# Patient Record
Sex: Female | Born: 1935 | Race: White | Hispanic: No | Marital: Single | Smoking: Former smoker
Health system: Southern US, Community
[De-identification: ages and names within clinical notes are randomized; demographics above are authoritative.]

## PROBLEM LIST (undated history)

## (undated) DIAGNOSIS — M1712 Unilateral primary osteoarthritis, left knee: Secondary | ICD-10-CM

## (undated) DIAGNOSIS — E559 Vitamin D deficiency, unspecified: Secondary | ICD-10-CM

## (undated) DIAGNOSIS — M545 Low back pain, unspecified: Secondary | ICD-10-CM

## (undated) DIAGNOSIS — L309 Dermatitis, unspecified: Secondary | ICD-10-CM

## (undated) DIAGNOSIS — E785 Hyperlipidemia, unspecified: Secondary | ICD-10-CM

## (undated) DIAGNOSIS — M79604 Pain in right leg: Secondary | ICD-10-CM

## (undated) DIAGNOSIS — K279 Peptic ulcer, site unspecified, unspecified as acute or chronic, without hemorrhage or perforation: Secondary | ICD-10-CM

## (undated) DIAGNOSIS — M79601 Pain in right arm: Secondary | ICD-10-CM

## (undated) DIAGNOSIS — N189 Chronic kidney disease, unspecified: Secondary | ICD-10-CM

## (undated) DIAGNOSIS — R600 Localized edema: Secondary | ICD-10-CM

## (undated) DIAGNOSIS — I5189 Other ill-defined heart diseases: Secondary | ICD-10-CM

## (undated) DIAGNOSIS — M109 Gout, unspecified: Secondary | ICD-10-CM

## (undated) DIAGNOSIS — M8580 Other specified disorders of bone density and structure, unspecified site: Secondary | ICD-10-CM

## (undated) DIAGNOSIS — M79602 Pain in left arm: Secondary | ICD-10-CM

## (undated) DIAGNOSIS — J449 Chronic obstructive pulmonary disease, unspecified: Secondary | ICD-10-CM

## (undated) HISTORY — PX: ABDOMINAL HYSTERECTOMY: SHX81

## (undated) HISTORY — DX: Localized edema: R60.0

## (undated) HISTORY — DX: Vitamin D deficiency, unspecified: E55.9

## (undated) HISTORY — DX: Chronic obstructive pulmonary disease, unspecified: J44.9

## (undated) HISTORY — DX: Other ill-defined heart diseases: I51.89

## (undated) HISTORY — DX: Hyperlipidemia, unspecified: E78.5

## (undated) HISTORY — DX: Chronic kidney disease, unspecified: N18.9

## (undated) HISTORY — PX: KIDNEY SURGERY: SHX687

## (undated) HISTORY — DX: Pain in right arm: M79.601

## (undated) HISTORY — PX: TUBAL LIGATION: SHX77

## (undated) HISTORY — DX: Dermatitis, unspecified: L30.9

## (undated) HISTORY — DX: Other specified disorders of bone density and structure, unspecified site: M85.80

## (undated) HISTORY — DX: Pain in right leg: M79.604

## (undated) HISTORY — DX: Low back pain, unspecified: M54.50

## (undated) HISTORY — DX: Peptic ulcer, site unspecified, unspecified as acute or chronic, without hemorrhage or perforation: K27.9

## (undated) HISTORY — DX: Hypercalcemia: E83.52

## (undated) HISTORY — DX: Unilateral primary osteoarthritis, left knee: M17.12

## (undated) HISTORY — PX: OVARIAN CYST SURGERY: SHX726

## (undated) HISTORY — PX: APPENDECTOMY: SHX54

## (undated) HISTORY — DX: Pain in left arm: M79.602

## (undated) HISTORY — DX: Gout, unspecified: M10.9

---
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
## 2015-01-17 DIAGNOSIS — R0902 Hypoxemia: Secondary | ICD-10-CM | POA: Diagnosis not present

## 2015-02-05 DIAGNOSIS — Z4689 Encounter for fitting and adjustment of other specified devices: Secondary | ICD-10-CM | POA: Diagnosis not present

## 2015-02-05 DIAGNOSIS — N8110 Cystocele, unspecified: Secondary | ICD-10-CM | POA: Diagnosis not present

## 2015-02-17 DIAGNOSIS — R0902 Hypoxemia: Secondary | ICD-10-CM | POA: Diagnosis not present

## 2015-03-06 DIAGNOSIS — E782 Mixed hyperlipidemia: Secondary | ICD-10-CM | POA: Diagnosis not present

## 2015-03-06 DIAGNOSIS — N183 Chronic kidney disease, stage 3 (moderate): Secondary | ICD-10-CM | POA: Diagnosis not present

## 2015-03-06 DIAGNOSIS — E538 Deficiency of other specified B group vitamins: Secondary | ICD-10-CM | POA: Diagnosis not present

## 2015-03-06 DIAGNOSIS — E559 Vitamin D deficiency, unspecified: Secondary | ICD-10-CM | POA: Diagnosis not present

## 2015-03-06 DIAGNOSIS — M109 Gout, unspecified: Secondary | ICD-10-CM | POA: Diagnosis not present

## 2015-03-13 DIAGNOSIS — I10 Essential (primary) hypertension: Secondary | ICD-10-CM | POA: Diagnosis not present

## 2015-03-13 DIAGNOSIS — E782 Mixed hyperlipidemia: Secondary | ICD-10-CM | POA: Diagnosis not present

## 2015-03-13 DIAGNOSIS — N183 Chronic kidney disease, stage 3 (moderate): Secondary | ICD-10-CM | POA: Diagnosis not present

## 2015-03-13 DIAGNOSIS — Z23 Encounter for immunization: Secondary | ICD-10-CM | POA: Diagnosis not present

## 2015-03-13 DIAGNOSIS — M79601 Pain in right arm: Secondary | ICD-10-CM | POA: Diagnosis not present

## 2015-03-13 DIAGNOSIS — M109 Gout, unspecified: Secondary | ICD-10-CM | POA: Diagnosis not present

## 2015-03-17 DIAGNOSIS — D485 Neoplasm of uncertain behavior of skin: Secondary | ICD-10-CM | POA: Diagnosis not present

## 2015-03-18 DIAGNOSIS — R0902 Hypoxemia: Secondary | ICD-10-CM | POA: Diagnosis not present

## 2015-04-10 DIAGNOSIS — Z9181 History of falling: Secondary | ICD-10-CM | POA: Diagnosis not present

## 2015-04-10 DIAGNOSIS — M79604 Pain in right leg: Secondary | ICD-10-CM | POA: Diagnosis not present

## 2015-04-10 DIAGNOSIS — Z6829 Body mass index (BMI) 29.0-29.9, adult: Secondary | ICD-10-CM | POA: Diagnosis not present

## 2015-04-13 DIAGNOSIS — N8110 Cystocele, unspecified: Secondary | ICD-10-CM | POA: Diagnosis not present

## 2015-04-13 DIAGNOSIS — Z4689 Encounter for fitting and adjustment of other specified devices: Secondary | ICD-10-CM | POA: Diagnosis not present

## 2015-04-13 DIAGNOSIS — L259 Unspecified contact dermatitis, unspecified cause: Secondary | ICD-10-CM | POA: Diagnosis not present

## 2015-04-18 DIAGNOSIS — R0902 Hypoxemia: Secondary | ICD-10-CM | POA: Diagnosis not present

## 2015-05-18 DIAGNOSIS — R0902 Hypoxemia: Secondary | ICD-10-CM | POA: Diagnosis not present

## 2015-06-17 DIAGNOSIS — Z4689 Encounter for fitting and adjustment of other specified devices: Secondary | ICD-10-CM | POA: Diagnosis not present

## 2015-06-17 DIAGNOSIS — N8110 Cystocele, unspecified: Secondary | ICD-10-CM | POA: Diagnosis not present

## 2015-06-17 DIAGNOSIS — B372 Candidiasis of skin and nail: Secondary | ICD-10-CM | POA: Diagnosis not present

## 2015-06-18 DIAGNOSIS — R0902 Hypoxemia: Secondary | ICD-10-CM | POA: Diagnosis not present

## 2015-07-09 DIAGNOSIS — Z8673 Personal history of transient ischemic attack (TIA), and cerebral infarction without residual deficits: Secondary | ICD-10-CM

## 2015-07-09 DIAGNOSIS — I10 Essential (primary) hypertension: Secondary | ICD-10-CM

## 2015-07-09 DIAGNOSIS — I340 Nonrheumatic mitral (valve) insufficiency: Secondary | ICD-10-CM | POA: Insufficient documentation

## 2015-07-09 DIAGNOSIS — E7800 Pure hypercholesterolemia, unspecified: Secondary | ICD-10-CM

## 2015-07-09 DIAGNOSIS — F17210 Nicotine dependence, cigarettes, uncomplicated: Secondary | ICD-10-CM

## 2015-07-09 DIAGNOSIS — N189 Chronic kidney disease, unspecified: Secondary | ICD-10-CM | POA: Diagnosis not present

## 2015-07-09 HISTORY — DX: Nonrheumatic mitral (valve) insufficiency: I34.0

## 2015-07-09 HISTORY — DX: Nicotine dependence, cigarettes, uncomplicated: F17.210

## 2015-07-09 HISTORY — DX: Chronic kidney disease, unspecified: N18.9

## 2015-07-09 HISTORY — DX: Pure hypercholesterolemia, unspecified: E78.00

## 2015-07-09 HISTORY — DX: Personal history of transient ischemic attack (TIA), and cerebral infarction without residual deficits: Z86.73

## 2015-07-09 HISTORY — DX: Essential (primary) hypertension: I10

## 2015-07-18 DIAGNOSIS — R0902 Hypoxemia: Secondary | ICD-10-CM | POA: Diagnosis not present

## 2015-07-27 DIAGNOSIS — I517 Cardiomegaly: Secondary | ICD-10-CM | POA: Diagnosis not present

## 2015-07-27 DIAGNOSIS — I083 Combined rheumatic disorders of mitral, aortic and tricuspid valves: Secondary | ICD-10-CM | POA: Diagnosis not present

## 2015-08-17 DIAGNOSIS — N8110 Cystocele, unspecified: Secondary | ICD-10-CM | POA: Diagnosis not present

## 2015-08-17 DIAGNOSIS — Z4689 Encounter for fitting and adjustment of other specified devices: Secondary | ICD-10-CM | POA: Diagnosis not present

## 2015-08-18 DIAGNOSIS — R0902 Hypoxemia: Secondary | ICD-10-CM | POA: Diagnosis not present

## 2015-09-18 DIAGNOSIS — R0902 Hypoxemia: Secondary | ICD-10-CM | POA: Diagnosis not present

## 2015-09-21 DIAGNOSIS — E782 Mixed hyperlipidemia: Secondary | ICD-10-CM | POA: Diagnosis not present

## 2015-09-21 DIAGNOSIS — M109 Gout, unspecified: Secondary | ICD-10-CM | POA: Diagnosis not present

## 2015-09-21 DIAGNOSIS — N183 Chronic kidney disease, stage 3 (moderate): Secondary | ICD-10-CM | POA: Diagnosis not present

## 2015-09-23 DIAGNOSIS — Z9181 History of falling: Secondary | ICD-10-CM | POA: Diagnosis not present

## 2015-09-23 DIAGNOSIS — M109 Gout, unspecified: Secondary | ICD-10-CM | POA: Diagnosis not present

## 2015-09-23 DIAGNOSIS — I10 Essential (primary) hypertension: Secondary | ICD-10-CM | POA: Diagnosis not present

## 2015-09-23 DIAGNOSIS — E782 Mixed hyperlipidemia: Secondary | ICD-10-CM | POA: Diagnosis not present

## 2015-09-23 DIAGNOSIS — Z1389 Encounter for screening for other disorder: Secondary | ICD-10-CM | POA: Diagnosis not present

## 2015-09-23 DIAGNOSIS — N183 Chronic kidney disease, stage 3 (moderate): Secondary | ICD-10-CM | POA: Diagnosis not present

## 2015-10-18 DIAGNOSIS — R0902 Hypoxemia: Secondary | ICD-10-CM | POA: Diagnosis not present

## 2015-10-22 DIAGNOSIS — N8110 Cystocele, unspecified: Secondary | ICD-10-CM | POA: Diagnosis not present

## 2015-10-22 DIAGNOSIS — Z4689 Encounter for fitting and adjustment of other specified devices: Secondary | ICD-10-CM | POA: Diagnosis not present

## 2015-11-18 DIAGNOSIS — R0902 Hypoxemia: Secondary | ICD-10-CM | POA: Diagnosis not present

## 2015-12-07 DIAGNOSIS — Z23 Encounter for immunization: Secondary | ICD-10-CM | POA: Diagnosis not present

## 2015-12-07 DIAGNOSIS — M549 Dorsalgia, unspecified: Secondary | ICD-10-CM | POA: Diagnosis not present

## 2015-12-07 DIAGNOSIS — Z6829 Body mass index (BMI) 29.0-29.9, adult: Secondary | ICD-10-CM | POA: Diagnosis not present

## 2015-12-18 DIAGNOSIS — R0902 Hypoxemia: Secondary | ICD-10-CM | POA: Diagnosis not present

## 2015-12-23 DIAGNOSIS — N8110 Cystocele, unspecified: Secondary | ICD-10-CM | POA: Diagnosis not present

## 2015-12-23 DIAGNOSIS — Z4689 Encounter for fitting and adjustment of other specified devices: Secondary | ICD-10-CM | POA: Diagnosis not present

## 2016-01-18 DIAGNOSIS — R0902 Hypoxemia: Secondary | ICD-10-CM | POA: Diagnosis not present

## 2016-02-09 DIAGNOSIS — E663 Overweight: Secondary | ICD-10-CM | POA: Diagnosis not present

## 2016-02-09 DIAGNOSIS — J209 Acute bronchitis, unspecified: Secondary | ICD-10-CM | POA: Diagnosis not present

## 2016-02-12 DIAGNOSIS — IMO0002 Reserved for concepts with insufficient information to code with codable children: Secondary | ICD-10-CM | POA: Insufficient documentation

## 2016-02-12 DIAGNOSIS — N393 Stress incontinence (female) (male): Secondary | ICD-10-CM | POA: Insufficient documentation

## 2016-02-12 DIAGNOSIS — Z09 Encounter for follow-up examination after completed treatment for conditions other than malignant neoplasm: Secondary | ICD-10-CM | POA: Insufficient documentation

## 2016-02-12 HISTORY — DX: Encounter for follow-up examination after completed treatment for conditions other than malignant neoplasm: Z09

## 2016-02-12 HISTORY — DX: Stress incontinence (female) (male): N39.3

## 2016-02-12 HISTORY — DX: Reserved for concepts with insufficient information to code with codable children: IMO0002

## 2016-02-18 DIAGNOSIS — R0902 Hypoxemia: Secondary | ICD-10-CM | POA: Diagnosis not present

## 2016-02-22 DIAGNOSIS — N8111 Cystocele, midline: Secondary | ICD-10-CM | POA: Diagnosis not present

## 2016-02-22 DIAGNOSIS — Z4689 Encounter for fitting and adjustment of other specified devices: Secondary | ICD-10-CM | POA: Diagnosis not present

## 2016-03-17 DIAGNOSIS — R0902 Hypoxemia: Secondary | ICD-10-CM | POA: Diagnosis not present

## 2016-03-22 DIAGNOSIS — M109 Gout, unspecified: Secondary | ICD-10-CM | POA: Diagnosis not present

## 2016-03-22 DIAGNOSIS — E782 Mixed hyperlipidemia: Secondary | ICD-10-CM | POA: Diagnosis not present

## 2016-03-22 DIAGNOSIS — E559 Vitamin D deficiency, unspecified: Secondary | ICD-10-CM | POA: Diagnosis not present

## 2016-03-22 DIAGNOSIS — N183 Chronic kidney disease, stage 3 (moderate): Secondary | ICD-10-CM | POA: Diagnosis not present

## 2016-04-04 DIAGNOSIS — M109 Gout, unspecified: Secondary | ICD-10-CM | POA: Diagnosis not present

## 2016-04-04 DIAGNOSIS — I10 Essential (primary) hypertension: Secondary | ICD-10-CM | POA: Diagnosis not present

## 2016-04-04 DIAGNOSIS — E782 Mixed hyperlipidemia: Secondary | ICD-10-CM | POA: Diagnosis not present

## 2016-04-04 DIAGNOSIS — N183 Chronic kidney disease, stage 3 (moderate): Secondary | ICD-10-CM | POA: Diagnosis not present

## 2016-04-04 DIAGNOSIS — Z139 Encounter for screening, unspecified: Secondary | ICD-10-CM | POA: Diagnosis not present

## 2016-04-04 DIAGNOSIS — D485 Neoplasm of uncertain behavior of skin: Secondary | ICD-10-CM | POA: Diagnosis not present

## 2016-04-17 DIAGNOSIS — R0902 Hypoxemia: Secondary | ICD-10-CM | POA: Diagnosis not present

## 2016-05-09 DIAGNOSIS — H1030 Unspecified acute conjunctivitis, unspecified eye: Secondary | ICD-10-CM | POA: Diagnosis not present

## 2016-05-17 DIAGNOSIS — R0902 Hypoxemia: Secondary | ICD-10-CM | POA: Diagnosis not present

## 2016-06-01 DIAGNOSIS — Z4689 Encounter for fitting and adjustment of other specified devices: Secondary | ICD-10-CM | POA: Diagnosis not present

## 2016-06-01 DIAGNOSIS — N8111 Cystocele, midline: Secondary | ICD-10-CM | POA: Diagnosis not present

## 2016-06-17 DIAGNOSIS — R0902 Hypoxemia: Secondary | ICD-10-CM | POA: Diagnosis not present

## 2016-07-06 DIAGNOSIS — I340 Nonrheumatic mitral (valve) insufficiency: Secondary | ICD-10-CM | POA: Diagnosis not present

## 2016-07-06 DIAGNOSIS — Z8673 Personal history of transient ischemic attack (TIA), and cerebral infarction without residual deficits: Secondary | ICD-10-CM | POA: Diagnosis not present

## 2016-07-06 DIAGNOSIS — I10 Essential (primary) hypertension: Secondary | ICD-10-CM | POA: Diagnosis not present

## 2016-07-06 DIAGNOSIS — N189 Chronic kidney disease, unspecified: Secondary | ICD-10-CM | POA: Diagnosis not present

## 2016-07-12 DIAGNOSIS — H25813 Combined forms of age-related cataract, bilateral: Secondary | ICD-10-CM | POA: Diagnosis not present

## 2016-07-17 DIAGNOSIS — R0902 Hypoxemia: Secondary | ICD-10-CM | POA: Diagnosis not present

## 2016-08-03 DIAGNOSIS — Z4689 Encounter for fitting and adjustment of other specified devices: Secondary | ICD-10-CM | POA: Diagnosis not present

## 2016-08-03 DIAGNOSIS — N8111 Cystocele, midline: Secondary | ICD-10-CM | POA: Diagnosis not present

## 2016-08-17 DIAGNOSIS — R0902 Hypoxemia: Secondary | ICD-10-CM | POA: Diagnosis not present

## 2016-09-02 DIAGNOSIS — H2511 Age-related nuclear cataract, right eye: Secondary | ICD-10-CM | POA: Diagnosis not present

## 2016-09-09 DIAGNOSIS — R768 Other specified abnormal immunological findings in serum: Secondary | ICD-10-CM | POA: Diagnosis not present

## 2016-09-09 DIAGNOSIS — R1013 Epigastric pain: Secondary | ICD-10-CM | POA: Diagnosis not present

## 2016-09-12 DIAGNOSIS — R112 Nausea with vomiting, unspecified: Secondary | ICD-10-CM | POA: Diagnosis not present

## 2016-09-12 DIAGNOSIS — K8020 Calculus of gallbladder without cholecystitis without obstruction: Secondary | ICD-10-CM | POA: Diagnosis not present

## 2016-09-12 DIAGNOSIS — R1013 Epigastric pain: Secondary | ICD-10-CM | POA: Diagnosis not present

## 2016-09-12 DIAGNOSIS — K529 Noninfective gastroenteritis and colitis, unspecified: Secondary | ICD-10-CM | POA: Diagnosis not present

## 2016-09-12 DIAGNOSIS — K829 Disease of gallbladder, unspecified: Secondary | ICD-10-CM | POA: Diagnosis not present

## 2016-09-16 DIAGNOSIS — Z79899 Other long term (current) drug therapy: Secondary | ICD-10-CM | POA: Diagnosis not present

## 2016-09-16 DIAGNOSIS — R1013 Epigastric pain: Secondary | ICD-10-CM | POA: Diagnosis not present

## 2016-09-16 DIAGNOSIS — R768 Other specified abnormal immunological findings in serum: Secondary | ICD-10-CM | POA: Diagnosis not present

## 2016-09-17 DIAGNOSIS — R0902 Hypoxemia: Secondary | ICD-10-CM | POA: Diagnosis not present

## 2016-09-22 DIAGNOSIS — H2511 Age-related nuclear cataract, right eye: Secondary | ICD-10-CM | POA: Diagnosis not present

## 2016-09-22 DIAGNOSIS — H25811 Combined forms of age-related cataract, right eye: Secondary | ICD-10-CM | POA: Diagnosis not present

## 2016-09-29 DIAGNOSIS — R933 Abnormal findings on diagnostic imaging of other parts of digestive tract: Secondary | ICD-10-CM | POA: Diagnosis not present

## 2016-09-29 DIAGNOSIS — R1013 Epigastric pain: Secondary | ICD-10-CM | POA: Diagnosis not present

## 2016-09-29 DIAGNOSIS — R197 Diarrhea, unspecified: Secondary | ICD-10-CM | POA: Diagnosis not present

## 2016-09-29 DIAGNOSIS — K3189 Other diseases of stomach and duodenum: Secondary | ICD-10-CM | POA: Diagnosis not present

## 2016-09-29 DIAGNOSIS — K449 Diaphragmatic hernia without obstruction or gangrene: Secondary | ICD-10-CM | POA: Diagnosis not present

## 2016-09-29 DIAGNOSIS — K8020 Calculus of gallbladder without cholecystitis without obstruction: Secondary | ICD-10-CM | POA: Diagnosis not present

## 2016-09-30 DIAGNOSIS — B9681 Helicobacter pylori [H. pylori] as the cause of diseases classified elsewhere: Secondary | ICD-10-CM | POA: Diagnosis not present

## 2016-09-30 DIAGNOSIS — K449 Diaphragmatic hernia without obstruction or gangrene: Secondary | ICD-10-CM | POA: Diagnosis not present

## 2016-09-30 DIAGNOSIS — K2950 Unspecified chronic gastritis without bleeding: Secondary | ICD-10-CM | POA: Diagnosis not present

## 2016-09-30 DIAGNOSIS — R197 Diarrhea, unspecified: Secondary | ICD-10-CM | POA: Diagnosis not present

## 2016-09-30 DIAGNOSIS — R1013 Epigastric pain: Secondary | ICD-10-CM | POA: Diagnosis not present

## 2016-09-30 DIAGNOSIS — K3189 Other diseases of stomach and duodenum: Secondary | ICD-10-CM | POA: Diagnosis not present

## 2016-10-03 DIAGNOSIS — M109 Gout, unspecified: Secondary | ICD-10-CM | POA: Diagnosis not present

## 2016-10-03 DIAGNOSIS — E782 Mixed hyperlipidemia: Secondary | ICD-10-CM | POA: Diagnosis not present

## 2016-10-03 DIAGNOSIS — E559 Vitamin D deficiency, unspecified: Secondary | ICD-10-CM | POA: Diagnosis not present

## 2016-10-03 DIAGNOSIS — N183 Chronic kidney disease, stage 3 (moderate): Secondary | ICD-10-CM | POA: Diagnosis not present

## 2016-10-04 DIAGNOSIS — K8020 Calculus of gallbladder without cholecystitis without obstruction: Secondary | ICD-10-CM | POA: Diagnosis not present

## 2016-10-04 DIAGNOSIS — R933 Abnormal findings on diagnostic imaging of other parts of digestive tract: Secondary | ICD-10-CM | POA: Diagnosis not present

## 2016-10-05 DIAGNOSIS — N8111 Cystocele, midline: Secondary | ICD-10-CM | POA: Diagnosis not present

## 2016-10-05 DIAGNOSIS — Z4689 Encounter for fitting and adjustment of other specified devices: Secondary | ICD-10-CM | POA: Diagnosis not present

## 2016-10-06 DIAGNOSIS — K56600 Partial intestinal obstruction, unspecified as to cause: Secondary | ICD-10-CM | POA: Diagnosis not present

## 2016-10-06 DIAGNOSIS — K8020 Calculus of gallbladder without cholecystitis without obstruction: Secondary | ICD-10-CM | POA: Diagnosis not present

## 2016-10-06 DIAGNOSIS — R197 Diarrhea, unspecified: Secondary | ICD-10-CM | POA: Diagnosis not present

## 2016-10-12 DIAGNOSIS — K8010 Calculus of gallbladder with chronic cholecystitis without obstruction: Secondary | ICD-10-CM | POA: Diagnosis not present

## 2016-10-12 HISTORY — DX: Calculus of gallbladder with chronic cholecystitis without obstruction: K80.10

## 2016-10-13 DIAGNOSIS — R1010 Upper abdominal pain, unspecified: Secondary | ICD-10-CM | POA: Diagnosis not present

## 2016-10-13 DIAGNOSIS — K8010 Calculus of gallbladder with chronic cholecystitis without obstruction: Secondary | ICD-10-CM | POA: Diagnosis not present

## 2016-10-17 DIAGNOSIS — K660 Peritoneal adhesions (postprocedural) (postinfection): Secondary | ICD-10-CM | POA: Diagnosis not present

## 2016-10-17 DIAGNOSIS — K8010 Calculus of gallbladder with chronic cholecystitis without obstruction: Secondary | ICD-10-CM | POA: Diagnosis not present

## 2016-10-17 DIAGNOSIS — Z8673 Personal history of transient ischemic attack (TIA), and cerebral infarction without residual deficits: Secondary | ICD-10-CM | POA: Diagnosis not present

## 2016-10-17 DIAGNOSIS — I10 Essential (primary) hypertension: Secondary | ICD-10-CM | POA: Diagnosis not present

## 2016-10-17 DIAGNOSIS — R0902 Hypoxemia: Secondary | ICD-10-CM | POA: Diagnosis not present

## 2016-10-17 DIAGNOSIS — J449 Chronic obstructive pulmonary disease, unspecified: Secondary | ICD-10-CM | POA: Diagnosis not present

## 2016-10-17 DIAGNOSIS — M109 Gout, unspecified: Secondary | ICD-10-CM | POA: Diagnosis not present

## 2016-10-17 DIAGNOSIS — I340 Nonrheumatic mitral (valve) insufficiency: Secondary | ICD-10-CM | POA: Diagnosis not present

## 2019-10-31 ENCOUNTER — Encounter: Payer: Self-pay | Admitting: *Deleted

## 2019-10-31 ENCOUNTER — Other Ambulatory Visit: Payer: Self-pay | Admitting: *Deleted

## 2019-10-31 ENCOUNTER — Encounter: Payer: Self-pay | Admitting: Cardiology

## 2019-11-01 ENCOUNTER — Encounter: Payer: Self-pay | Admitting: Cardiology

## 2019-11-01 ENCOUNTER — Ambulatory Visit (INDEPENDENT_AMBULATORY_CARE_PROVIDER_SITE_OTHER): Payer: Medicare Other | Admitting: Cardiology

## 2019-11-01 ENCOUNTER — Other Ambulatory Visit: Payer: Self-pay

## 2019-11-01 VITALS — BP 178/74 | HR 60 | Ht 62.0 in | Wt 168.8 lb

## 2019-11-01 DIAGNOSIS — I340 Nonrheumatic mitral (valve) insufficiency: Secondary | ICD-10-CM

## 2019-11-01 DIAGNOSIS — Z1329 Encounter for screening for other suspected endocrine disorder: Secondary | ICD-10-CM

## 2019-11-01 DIAGNOSIS — I10 Essential (primary) hypertension: Secondary | ICD-10-CM | POA: Diagnosis not present

## 2019-11-01 DIAGNOSIS — R0600 Dyspnea, unspecified: Secondary | ICD-10-CM

## 2019-11-01 DIAGNOSIS — F17210 Nicotine dependence, cigarettes, uncomplicated: Secondary | ICD-10-CM

## 2019-11-01 DIAGNOSIS — R0609 Other forms of dyspnea: Secondary | ICD-10-CM

## 2019-11-01 DIAGNOSIS — E7800 Pure hypercholesterolemia, unspecified: Secondary | ICD-10-CM | POA: Diagnosis not present

## 2019-11-01 DIAGNOSIS — R600 Localized edema: Secondary | ICD-10-CM

## 2019-11-01 HISTORY — DX: Dyspnea, unspecified: R06.00

## 2019-11-01 HISTORY — DX: Localized edema: R60.0

## 2019-11-01 HISTORY — DX: Other forms of dyspnea: R06.09

## 2019-11-01 NOTE — Addendum Note (Signed)
Addended by: Beckey Rutter on: 11/01/2019 02:20 PM   Modules accepted: Orders

## 2019-11-01 NOTE — Progress Notes (Signed)
Cardiology Office Note:    Date:  11/01/2019   ID:  Tiffany Mitchell, DOB 15-Oct-1930, MRN [id number]  PCP:  Cyndi Bender, PA-C  Cardiologist:  Jenean Lindau, MD   Referring MD: Cyndi Bender, PA-C    ASSESSMENT:    1. Moderate mitral insufficiency   2. Pure hypercholesterolemia   3. Essential hypertension   4. DOE (dyspnea on exertion)   5. Pedal edema    PLAN:    In order of problems listed above:  1. Shortness of breath on exertion: I suspect with that she has an element of congestive heart failure.  I would also like to assess her mitral regurgitation.  In view of this she will have an echocardiogram to assess this.  I would also get her blood work today including BNP and a D-dimer.  I would like to make sure there is no evidence of any thromboembolism issues. 2. Pedal edema right greater than left.  This has been persistent for the past several months we will get a DVT study to make sure that there is no issue of venous obstruction in all extremities. 3. Essential hypertension: Blood pressure stable 4. Cigarette smoker: I spent 5 minutes with the patient discussing solely about smoking. Smoking cessation was counseled. I suggested to the patient also different medications and pharmacological interventions. Patient is keen to try stopping on its own at this time. He will get back to me if he needs any further assistance in this matter. 5. Patient will be seen in follow-up appointment in 2 months or earlier if the patient has any concerns    Medication Adjustments/Labs and Tests Ordered: Current medicines are reviewed at length with the patient today.  Concerns regarding medicines are outlined above.  No orders of the defined types were placed in this encounter.  No orders of the defined types were placed in this encounter.    History of Present Illness:    Tiffany Mitchell is a 85 y.o. female who is being seen today for the evaluation of pedal edema and shortness  of breath at the request of Cyndi Bender, PA-C.  Patient is a pleasant 85 year old female.  She has past medical history of moderate mitral regurgitation, essential hypertension.  She mentions to me that she has been having shortness of breath on exertion no orthopnea or PND.  This is been going on for the past several weeks.  At the time of my evaluation, the patient is alert awake oriented and in no distress.  Past Medical History:  Diagnosis Date  . Bilateral leg edema   . Bilateral leg edema   . Calculus of gallbladder with chronic cholecystitis without obstruction 10/12/2016  . Chronic kidney disease 07/09/2015  . Cigarette smoker 07/09/2015  . CKD (chronic kidney disease)   . COPD (chronic obstructive pulmonary disease) ([place])   . Cystocele 02/12/2016  . Diastolic dysfunction   . Eczema   . Essential hypertension 07/09/2015  . Gout   . Hypercalcemia   . Hyperlipidemia   . Lumbar pain with radiation down right leg   . Lumbar pain with radiation down right leg   . Moderate mitral insufficiency 07/09/2015  . Osteoarthritis of left knee   . Osteopenia   . Pain in both upper extremities   . Peptic ulcer disease   . Personal history of transient ischemic attack (TIA), and cerebral infarction without residual deficits 07/09/2015  . Postoperative examination 02/12/2016  . Pure hypercholesterolemia 07/09/2015  . Stress incontinence in  female 02/12/2016  . Vitamin D deficiency     Past Surgical History:  Procedure Laterality Date  . ABDOMINAL HYSTERECTOMY    . APPENDECTOMY    . KIDNEY SURGERY    . OVARIAN CYST SURGERY    . TUBAL LIGATION      Current Medications: Current Meds  Medication Sig  . acetaminophen (TYLENOL) 650 MG CR tablet Take 650 mg by mouth every 8 (eight) hours as needed for pain.  Marland Kitchen allopurinol (ZYLOPRIM) 100 MG tablet Take 100 mg by mouth daily.  Marland Kitchen aspirin 81 MG chewable tablet Chew 81 mg by mouth 2 (two) times daily.   . carvedilol (COREG) 12.5 MG tablet 12.5 mg  2 (two) times daily.  . Cholecalciferol (VITAMIN D3) 50 MCG (2000 UT) capsule Take by mouth.  . clobetasol cream (TEMOVATE) 0.05 % Apply topically.  . conjugated estrogens (PREMARIN) vaginal cream Place vaginally.  . doxazosin (CARDURA) 8 MG tablet Take 8 mg by mouth at bedtime.  . enalapril (VASOTEC) 20 MG tablet Take 20 mg by mouth 2 (two) times daily.  . furosemide (LASIX) 20 MG tablet 40 mg daily.   . meloxicam (MOBIC) 15 MG tablet Take 15 mg by mouth daily.  . Oxyquinoline-Sod Lauryl Sulf (TRIMO-SAN) 0.025-0.01 % GEL Apply vaginally as needed  . pantoprazole (PROTONIX) 40 MG tablet Take 40 mg by mouth daily.  . pravastatin (PRAVACHOL) 40 MG tablet Take 40 mg by mouth at bedtime.  . Prenatal Vit-Fe Fumarate-FA (PNV PRENATAL PLUS MULTIVITAMIN) 27-1 MG TABS Take by mouth.  . tiotropium (SPIRIVA) 18 MCG inhalation capsule Place into inhaler and inhale.  . triamcinolone cream (KENALOG) 0.1 %   . vitamin B-12 (CYANOCOBALAMIN) 100 MCG tablet Take 100 mcg by mouth daily.     Allergies:   Amlodipine and Hydrocodone-acetaminophen   Social History   Socioeconomic History  . Marital status: Single    Spouse name: Not on file  . Number of children: Not on file  . Years of education: Not on file  . Highest education level: Not on file  Occupational History  . Not on file  Social Needs  . Financial resource strain: Not on file  . Food insecurity    Worry: Not on file    Inability: Not on file  . Transportation needs    Medical: Not on file    Non-medical: Not on file  Tobacco Use  . Smoking status: Current Some Day Smoker    Packs/day: 0.25    Types: Cigarettes  . Smokeless tobacco: Never Used  Substance and Sexual Activity  . Alcohol use: Not on file  . Drug use: Not on file  . Sexual activity: Not on file  Lifestyle  . Physical activity    Days per week: Not on file    Minutes per session: Not on file  . Stress: Not on file  Relationships  . Social Herbalist on  phone: Not on file    Gets together: Not on file    Attends religious service: Not on file    Active member of club or organization: Not on file    Attends meetings of clubs or organizations: Not on file    Relationship status: Not on file  Other Topics Concern  . Not on file  Social History Narrative  . Not on file     Family History: The patient's family history includes Diabetes in her sister.  ROS:   Please see the history of present illness.  All other systems reviewed and are negative.  EKGs/Labs/Other Studies Reviewed:    The following studies were reviewed today: EKG reveals sinus rhythm and nonspecific ST-T changes and poor anterior forces.   Recent Labs: No results found for requested labs within last 8760 hours.  Recent Lipid Panel No results found for: CHOL, TRIG, HDL, CHOLHDL, VLDL, LDLCALC, LDLDIRECT  Physical Exam:    VS:  BP (!) 178/74 (BP Location: Left Arm, Patient Position: Sitting, Cuff Size: Normal)   Pulse 60   Ht 5\' 2"  (1.575 m)   Wt 168 lb 12.8 oz (76.6 kg)   SpO2 96%   BMI 30.87 kg/m     Wt Readings from Last 3 Encounters:  11/01/19 168 lb 12.8 oz (76.6 kg)  10/09/19 173 lb (78.5 kg)     GEN: Patient is in no acute distress HEENT: Normal NECK: No JVD; No carotid bruits LYMPHATICS: No lymphadenopathy CARDIAC: S1 S2 regular, 2/6 systolic murmur at the apex. RESPIRATORY:  Clear to auscultation without rales, wheezing or rhonchi  ABDOMEN: Soft, non-tender, non-distended MUSCULOSKELETAL: Bilateral pedal edema right greater than left; No deformity  SKIN: Warm and dry NEUROLOGIC:  Alert and oriented x 3 PSYCHIATRIC:  Normal affect    Signed, Jenean Lindau, MD  11/01/2019 2:09 PM    [place]

## 2019-11-01 NOTE — Patient Instructions (Addendum)
Medication Instructions:  Your physician has recommended you make the following change in your medication:   START taking aspirin 81 mg (1 tablet) once daily *If you need a refill on your cardiac medications before your next appointment, please call your pharmacy*  Lab Work: Your physician recommends that you have a D-Dimer, BMP, CBC, TSH, hepatic and BNP drawn today  If you have labs (blood work) drawn today and your tests are completely normal, you will receive your results only by: Marland Kitchen MyChart Message (if you have MyChart) OR . A paper copy in the mail If you have any lab test that is abnormal or we need to change your treatment, we will call you to review the results.  Testing/Procedures: Your physician has requested that you have an echocardiogram. Echocardiography is a painless test that uses sound waves to create images of your heart. It provides your doctor with information about the size and shape of your heart and how well your heart's chambers and valves are working. This procedure takes approximately one hour. There are no restrictions for this procedure.  Your physician has requested that you have a DVT study performed.   Follow-Up: At [facility], you and your health needs are our priority.  As part of our continuing mission to provide you with exceptional heart care, we have created designated Provider Care Teams.  These Care Teams include your primary Cardiologist (physician) and Advanced Practice Providers (APPs -  Physician Assistants and Nurse Practitioners) who all work together to provide you with the care you need, when you need it.  Your next appointment:   2 mo   The format for your next appointment:   In Person  Provider:   Jyl Heinz, MD  Other Instructions  Deep Vein Thrombosis  Deep vein thrombosis (DVT) is a condition in which a blood clot forms in a deep vein, such as a lower leg, thigh, or arm vein. A clot is blood that has thickened into a gel  or solid. This condition is dangerous. It can lead to serious and even life-threatening complications if the clot travels to the lungs and causes a blockage (pulmonary embolism). It can also damage veins in the leg. This can result in leg pain, swelling, discoloration, and sores (post-thrombotic syndrome). What are the causes? This condition may be caused by:  A slowdown of blood flow.  Damage to a vein.  A condition that causes blood to clot more easily, such as an inherited clotting disorder. What increases the risk? The following factors may make you more likely to develop this condition:  Being overweight.  Being older, especially over age 52.  Sitting or lying down for more than four hours.  Being in the hospital.  Lack of physical activity (sedentary lifestyle).  Pregnancy, being in childbirth, or having recently given birth.  Taking medicines that contain estrogen, such as medicines to prevent pregnancy.  Smoking.  A history of any of the following: ? Blood clots or a blood clotting disease. ? Peripheral vascular disease. ? Inflammatory bowel disease. ? Cancer. ? Heart disease. ? Genetic conditions that affect how your blood clots, such as Factor V Leiden mutation. ? Neurological diseases that affect your legs (leg paresis). ? A recent injury, such as a car accident. ? Major or lengthy surgery. ? A central line placed inside a large vein. What are the signs or symptoms? Symptoms of this condition include:  Swelling, pain, or tenderness in an arm or leg.  Warmth, redness, or  discoloration in an arm or leg. If the clot is in your leg, symptoms may be more noticeable or worse when you stand or walk. Some people may not develop any symptoms. How is this diagnosed? This condition is diagnosed with:  A medical history and physical exam.  Tests, such as: ? Blood tests. These are done to check how well your blood clots. ? Ultrasound. This is done to check for  clots. ? Venogram. For this test, contrast dye is injected into a vein and X-rays are taken to check for any clots. How is this treated? Treatment for this condition depends on:  The cause of your DVT.  Your risk for bleeding or developing more clots.  Any other medical conditions that you have. Treatment may include:  Taking a blood thinner (anticoagulant). This type of medicine prevents clots from forming. It may be taken by mouth, injected under the skin, or injected through an IV (catheter).  Injecting clot-dissolving medicines into the affected vein (catheter-directed thrombolysis).  Having surgery. Surgery may be done to: ? Remove the clot. ? Place a filter in a large vein to catch blood clots before they reach the lungs. Some treatments may be continued for up to six months. Follow these instructions at home: If you are taking blood thinners:  Take the medicine exactly as told by your health care provider. Some blood thinners need to be taken at the same time every day. Do not skip a dose.  Talk with your health care provider before you take any medicines that contain aspirin or NSAIDs. These medicines increase your risk for dangerous bleeding.  Ask your health care provider about foods and drugs that could change the way the medicine works (may interact). Avoid those things if your health care provider tells you to do so.  Blood thinners can cause easy bruising and may make it difficult to stop bleeding. Because of this: ? Be very careful when using knives, scissors, or other sharp objects. ? Use an electric razor instead of a blade. ? Avoid activities that could cause injury or bruising, and follow instructions about how to prevent falls.  Wear a medical alert bracelet or carry a card that lists what medicines you take. General instructions  Take over-the-counter and prescription medicines only as told by your health care provider.  Return to your normal activities as  told by your health care provider. Ask your health care provider what activities are safe for you.  Wear compression stockings if recommended by your health care provider.  Keep all follow-up visits as told by your health care provider. This is important. How is this prevented? To lower your risk of developing this condition again:  For 30 or more minutes every day, do an activity that: ? Involves moving your arms and legs. ? Increases your heart rate.  When traveling for longer than four hours: ? Exercise your arms and legs every hour. ? Drink plenty of water. ? Avoid drinking alcohol.  Avoid sitting or lying for a long time without moving your legs.  If you have surgery or you are hospitalized, ask about ways to prevent blood clots. These may include taking frequent walks or using anticoagulants.  Stay at a healthy weight.  If you are a woman who is older than age 55, avoid unnecessary use of medicines that contain estrogen, such as some birth control pills.  Do not use any products that contain nicotine or tobacco, such as cigarettes and e-cigarettes. This is especially  important if you take estrogen medicines. If you need help quitting, ask your health care provider. Contact a health care provider if:  You miss a dose of your blood thinner.  Your menstrual period is heavier than usual.  You have unusual bruising. Get help right away if:  You have: ? New or increased pain, swelling, or redness in an arm or leg. ? Numbness or tingling in an arm or leg. ? Shortness of breath. ? Chest pain. ? A rapid or irregular heartbeat. ? A severe headache or confusion. ? A cut that will not stop bleeding.  There is blood in your vomit, stool, or urine.  You have a serious fall or accident, or you hit your head.  You feel light-headed or dizzy.  You cough up blood. These symptoms may represent a serious problem that is an emergency. Do not wait to see if the symptoms will go  away. Get medical help right away. Call your local emergency services (911 in the [place] Do not drive yourself to the hospital. Summary  Deep vein thrombosis (DVT) is a condition in which a blood clot forms in a deep vein, such as a lower leg, thigh, or arm vein.  Symptoms can include swelling, warmth, pain, and redness in your leg or arm.  This condition may be treated with a blood thinner (anticoagulant medicine), medicine that is injected to dissolve blood clots,compression stockings, or surgery.  If you are prescribed blood thinners, take them exactly as told. This information is not intended to replace advice given to you by your health care provider. Make sure you discuss any questions you have with your health care provider. Document Released: 12/12/2005 Document Revised: 11/24/2017 Document Reviewed: 05/12/2017 Elsevier Patient Education  [place].   Echocardiogram An echocardiogram is a procedure that uses painless sound waves (ultrasound) to produce an image of the heart. Images from an echocardiogram can provide important information about:  Signs of coronary artery disease (CAD).  Aneurysm detection. An aneurysm is a weak or damaged part of an artery wall that bulges out from the normal force of blood pumping through the body.  Heart size and shape. Changes in the size or shape of the heart can be associated with certain conditions, including heart failure, aneurysm, and CAD.  Heart muscle function.  Heart valve function.  Signs of a past heart attack.  Fluid buildup around the heart.  Thickening of the heart muscle.  A tumor or infectious growth around the heart valves. Tell a health care provider about:  Any allergies you have.  All medicines you are taking, including vitamins, herbs, eye drops, creams, and over-the-counter medicines.  Any blood disorders you have.  Any surgeries you have had.  Any medical conditions you have.  Whether you are  pregnant or may be pregnant. What are the risks? Generally, this is a safe procedure. However, problems may occur, including:  Allergic reaction to dye (contrast) that may be used during the procedure. What happens before the procedure? No specific preparation is needed. You may eat and drink normally. What happens during the procedure?   An IV tube may be inserted into one of your veins.  You may receive contrast through this tube. A contrast is an injection that improves the quality of the pictures from your heart.  A gel will be applied to your chest.  A wand-like tool (transducer) will be moved over your chest. The gel will help to transmit the sound waves from the transducer.  The sound waves will harmlessly bounce off of your heart to allow the heart images to be captured in real-time motion. The images will be recorded on a computer. The procedure may vary among health care providers and hospitals. What happens after the procedure?  You may return to your normal, everyday life, including diet, activities, and medicines, unless your health care provider tells you not to do that. Summary  An echocardiogram is a procedure that uses painless sound waves (ultrasound) to produce an image of the heart.  Images from an echocardiogram can provide important information about the size and shape of your heart, heart muscle function, heart valve function, and fluid buildup around your heart.  You do not need to do anything to prepare before this procedure. You may eat and drink normally.  After the echocardiogram is completed, you may return to your normal, everyday life, unless your health care provider tells you not to do that. This information is not intended to replace advice given to you by your health care provider. Make sure you discuss any questions you have with your health care provider. Document Released: 12/09/2000 Document Revised: 04/04/2019 Document Reviewed: 01/14/2017  Elsevier Patient Education  2020 [employer].

## 2019-11-02 ENCOUNTER — Emergency Department (HOSPITAL_BASED_OUTPATIENT_CLINIC_OR_DEPARTMENT_OTHER): Payer: Medicare Other

## 2019-11-02 ENCOUNTER — Encounter (HOSPITAL_BASED_OUTPATIENT_CLINIC_OR_DEPARTMENT_OTHER): Payer: Self-pay | Admitting: *Deleted

## 2019-11-02 ENCOUNTER — Emergency Department (HOSPITAL_BASED_OUTPATIENT_CLINIC_OR_DEPARTMENT_OTHER)
Admission: EM | Admit: 2019-11-02 | Discharge: 2019-11-02 | Disposition: A | Discharge status: Home / Self Care | Payer: Medicare Other | Attending: Emergency Medicine | Admitting: Emergency Medicine

## 2019-11-02 ENCOUNTER — Other Ambulatory Visit: Payer: Self-pay

## 2019-11-02 DIAGNOSIS — N189 Chronic kidney disease, unspecified: Secondary | ICD-10-CM | POA: Diagnosis not present

## 2019-11-02 DIAGNOSIS — I130 Hypertensive heart and chronic kidney disease with heart failure and stage 1 through stage 4 chronic kidney disease, or unspecified chronic kidney disease: Secondary | ICD-10-CM | POA: Diagnosis not present

## 2019-11-02 DIAGNOSIS — Z7982 Long term (current) use of aspirin: Secondary | ICD-10-CM | POA: Insufficient documentation

## 2019-11-02 DIAGNOSIS — R2243 Localized swelling, mass and lump, lower limb, bilateral: Secondary | ICD-10-CM | POA: Insufficient documentation

## 2019-11-02 DIAGNOSIS — R7989 Other specified abnormal findings of blood chemistry: Secondary | ICD-10-CM

## 2019-11-02 DIAGNOSIS — I509 Heart failure, unspecified: Secondary | ICD-10-CM | POA: Insufficient documentation

## 2019-11-02 DIAGNOSIS — F17210 Nicotine dependence, cigarettes, uncomplicated: Secondary | ICD-10-CM | POA: Diagnosis not present

## 2019-11-02 DIAGNOSIS — J449 Chronic obstructive pulmonary disease, unspecified: Secondary | ICD-10-CM | POA: Insufficient documentation

## 2019-11-02 DIAGNOSIS — Z79899 Other long term (current) drug therapy: Secondary | ICD-10-CM | POA: Diagnosis not present

## 2019-11-02 DIAGNOSIS — R791 Abnormal coagulation profile: Secondary | ICD-10-CM | POA: Diagnosis present

## 2019-11-02 LAB — BASIC METABOLIC PANEL
BUN/Creatinine Ratio: 16 (ref 12–28)
BUN: 14 mg/dL (ref 8–27)
CO2: 30 mmol/L — ABNORMAL HIGH (ref 20–29)
Calcium: 9.5 mg/dL (ref 8.7–10.3)
Chloride: 103 mmol/L (ref 96–106)
Creatinine, Ser: 0.89 mg/dL (ref 0.57–1.00)
GFR calc Af Amer: 67 mL/min/{1.73_m2} (ref >59)
GFR calc non Af Amer: 58 mL/min/{1.73_m2} — ABNORMAL LOW (ref >59)
Glucose: 100 mg/dL — ABNORMAL HIGH (ref 65–99)
Potassium: 4 mmol/L (ref 3.5–5.2)
Sodium: 147 mmol/L — ABNORMAL HIGH (ref 134–144)

## 2019-11-02 LAB — HEPATIC FUNCTION PANEL
ALT: 8 IU/L (ref 0–32)
AST: 16 IU/L (ref 0–40)
Albumin: 4.2 g/dL (ref 3.6–4.6)
Alkaline Phosphatase: 67 IU/L (ref 39–117)
Bilirubin Total: 0.4 mg/dL (ref 0.0–1.2)
Bilirubin, Direct: 0.12 mg/dL (ref 0.00–0.40)
Total Protein: 6 g/dL (ref 6.0–8.5)

## 2019-11-02 LAB — D-DIMER, QUANTITATIVE: D-DIMER: 1.76 mg/L FEU — ABNORMAL HIGH (ref 0.00–0.49)

## 2019-11-02 LAB — CBC
Hematocrit: 33.2 % — ABNORMAL LOW (ref 34.0–46.6)
Hemoglobin: 11.2 g/dL (ref 11.1–15.9)
MCH: 30.7 pg (ref 26.6–33.0)
MCHC: 33.7 g/dL (ref 31.5–35.7)
MCV: 91 fL (ref 79–97)
Platelets: 222 10*3/uL (ref 150–450)
RBC: 3.65 x10E6/uL — ABNORMAL LOW (ref 3.77–5.28)
RDW: 12.6 % (ref 11.7–15.4)
WBC: 8.8 10*3/uL (ref 3.4–10.8)

## 2019-11-02 LAB — TSH: TSH: 2.56 u[IU]/mL (ref 0.450–4.500)

## 2019-11-02 LAB — PRO B NATRIURETIC PEPTIDE: NT-Pro BNP: 1014 pg/mL — ABNORMAL HIGH (ref 0–738)

## 2019-11-02 MED ORDER — IOHEXOL 350 MG/ML SOLN
100.0000 mL | Freq: Once | INTRAVENOUS | Status: AC | PRN
  Administered 2019-11-02: 70 mL via INTRAVENOUS

## 2019-11-02 NOTE — ED Provider Notes (Signed)
[facility] EMERGENCY DEPARTMENT Provider Note   CSN: [id number] Arrival date & time: 11/02/19  1441     History   Chief Complaint Chief Complaint  Patient presents with   Abnormal Lab    HPI Tiffany Mitchell is a 85 y.o. female.     The history is provided by the patient and medical records. No language interpreter was used.   Tiffany Mitchell is a 85 y.o. female who presents to the Emergency Department complaining of abnormal lab. She presents to the emergency department accompanied by her daughter for evaluation of lab abnormality. She had a routine checkup with her cardiologist yesterday and had outpatient BNP, BMP, TSH, D dimer obtain. She was told to present to the emergency department due to abnormalities in her D dimer. She does complain of one month of bilateral lower extremity edema, right greater than left as well as dyspnea on exertion and dyspnea on waking in the morning. She has been compliant with her home medications but feels like her leg swelling is persistent. She denies any fevers, cough, chest pain, abdominal pain, nausea, vomiting. She has diarrhea, unchanged from baseline. No known COVID19 exposures. Symptoms are moderate in nature. Past Medical History:  Diagnosis Date   Bilateral leg edema    Bilateral leg edema    Calculus of gallbladder with chronic cholecystitis without obstruction 10/12/2016   Chronic kidney disease 07/09/2015   Cigarette smoker 07/09/2015   CKD (chronic kidney disease)    COPD (chronic obstructive pulmonary disease) ([place])    Cystocele 99991111   Diastolic dysfunction    Eczema    Essential hypertension 07/09/2015   Gout    Hypercalcemia    Hyperlipidemia    Lumbar pain with radiation down right leg    Lumbar pain with radiation down right leg    Moderate mitral insufficiency 07/09/2015   Osteoarthritis of left knee    Osteopenia    Pain in both upper extremities    Peptic ulcer disease     Personal history of transient ischemic attack (TIA), and cerebral infarction without residual deficits 07/09/2015   Postoperative examination 02/12/2016   Pure hypercholesterolemia 07/09/2015   Stress incontinence in female 02/12/2016   Vitamin D deficiency     Patient Active Problem List   Diagnosis Date Noted   DOE (dyspnea on exertion) 11/01/2019   Pedal edema 11/01/2019   Calculus of gallbladder with chronic cholecystitis without obstruction 10/12/2016   Cystocele 02/12/2016   Stress incontinence in female 02/12/2016   Postoperative examination 02/12/2016   Chronic kidney disease 07/09/2015   Cigarette smoker 07/09/2015   Essential hypertension 07/09/2015   Moderate mitral insufficiency 07/09/2015   Personal history of transient ischemic attack (TIA), and cerebral infarction without residual deficits 07/09/2015   Pure hypercholesterolemia 07/09/2015    Past Surgical History:  Procedure Laterality Date   ABDOMINAL HYSTERECTOMY     APPENDECTOMY     KIDNEY SURGERY     OVARIAN CYST SURGERY     TUBAL LIGATION       OB History    Gravida  3   Para  3   Term  3   Preterm      AB      Living        SAB      TAB      Ectopic      Multiple      Live Births  Home Medications    Prior to Admission medications   Medication Sig Start Date End Date Taking? Authorizing Provider  allopurinol (ZYLOPRIM) 100 MG tablet Take 100 mg by mouth daily. 08/29/19  Yes [provider]  aspirin 81 MG chewable tablet Chew 81 mg by mouth 2 (two) times daily.    Yes [provider]  carvedilol (COREG) 12.5 MG tablet 12.5 mg 2 (two) times daily. 12/08/15  Yes [provider]  Cholecalciferol (VITAMIN D3) 50 MCG (2000 UT) capsule Take by mouth.   Yes [provider]  clobetasol cream (TEMOVATE) 0.05 % Apply topically. 10/22/18  Yes [provider]  conjugated estrogens (PREMARIN) vaginal cream Place  vaginally. 08/12/19  Yes [provider]  doxazosin (CARDURA) 8 MG tablet Take 8 mg by mouth at bedtime. 10/09/19  Yes [provider]  enalapril (VASOTEC) 20 MG tablet Take 20 mg by mouth 2 (two) times daily. 10/09/19  Yes [provider]  furosemide (LASIX) 20 MG tablet 40 mg daily.  05/30/16  Yes [provider]  Oxyquinoline-Sod Lauryl Sulf (TRIMO-SAN) 0.025-0.01 % GEL Apply vaginally as needed 10/22/18  Yes [provider]  pantoprazole (PROTONIX) 40 MG tablet Take 40 mg by mouth daily. 08/29/19  Yes [provider]  pravastatin (PRAVACHOL) 40 MG tablet Take 40 mg by mouth at bedtime. 10/09/19  Yes [provider]  Prenatal Vit-Fe Fumarate-FA (PNV PRENATAL PLUS MULTIVITAMIN) 27-1 MG TABS Take by mouth.   Yes [provider]  tiotropium (SPIRIVA) 18 MCG inhalation capsule Place into inhaler and inhale.   Yes [provider]  triamcinolone cream (KENALOG) 0.1 %  07/12/18  Yes [provider]  vitamin B-12 (CYANOCOBALAMIN) 100 MCG tablet Take 100 mcg by mouth daily.   Yes [provider]  acetaminophen (TYLENOL) 650 MG CR tablet Take 650 mg by mouth every 8 (eight) hours as needed for pain.    [provider]    Family History Family History  Problem Relation Age of Onset   Diabetes Sister     Social History Social History   Tobacco Use   Smoking status: Current Some Day Smoker    Packs/day: 0.25    Types: Cigarettes   Smokeless tobacco: Never Used  Substance Use Topics   Alcohol use: Never    Frequency: Never   Drug use: Never     Allergies   Amlodipine and Hydrocodone-acetaminophen   Review of Systems Review of Systems  All other systems reviewed and are negative.    Physical Exam Updated Vital Signs BP (!) 222/67    Pulse (!) 59    Temp 98.1 F (36.7 C) (Oral)    Resp (!) 21    Ht 5\' 2"  (1.575 m)    Wt 76.7 kg    SpO2 99%    BMI 30.95 kg/m   Physical  Exam Vitals signs and nursing note reviewed.  Constitutional:      Appearance: She is well-developed.  HENT:     Head: Normocephalic and atraumatic.  Cardiovascular:     Rate and Rhythm: Normal rate and regular rhythm.     Heart sounds: No murmur.  Pulmonary:     Effort: Pulmonary effort is normal. No respiratory distress.     Breath sounds: Normal breath sounds.  Abdominal:     Palpations: Abdomen is soft.     Tenderness: There is no abdominal tenderness. There is no guarding or rebound.  Musculoskeletal:        General:  No tenderness.     Comments: 2+ pitting edema to bilateral lower extremities, right greater than left.  Skin:    General: Skin is warm and dry.  Neurological:     Mental Status: She is alert and oriented to person, place, and time.  Psychiatric:        Behavior: Behavior normal.      ED Treatments / Results  Labs (all labs ordered are listed, but only abnormal results are displayed) Labs Reviewed - No data to display  EKG EKG Interpretation  Date/Time:  Saturday November 02 2019 15:19:33 EST Ventricular Rate:  50 PR Interval:    QRS Duration: 115 QT Interval:  443 QTC Calculation: 404 R Axis:   -27 Text Interpretation: Sinus rhythm Supraventricular bigeminy Incomplete left bundle branch block LVH with secondary repolarization abnormality Anterior Q waves, possibly due to LVH Baseline wander in lead(s) II no prior available for comparison Confirmed by Quintella Reichert [id number]) on 11/02/2019 3:38:58 PM   Radiology Ct Angio Chest Pe W/cm &/or Wo Cm  Result Date: 11/02/2019 CLINICAL DATA:  PE suspected, intermediate probability, positive D-dimer, history of COPD and current smoking EXAM: CT ANGIOGRAPHY CHEST WITH CONTRAST TECHNIQUE: Multidetector CT imaging of the chest was performed using the standard protocol during bolus administration of intravenous contrast. Multiplanar CT image reconstructions and MIPs were obtained to evaluate the vascular anatomy.  CONTRAST:  33mL OMNIPAQUE IOHEXOL 350 MG/ML SOLN COMPARISON:  CT enterography 10/04/2016 FINDINGS: Cardiovascular: Satisfactory opacification the pulmonary arteries to the segmental level. No pulmonary artery filling defects are identified. Borderline enlargement of the central pulmonary arteries without elevation of the RV/LV ratio (0.78). Cardiac size at the upper limits of normal. Coronary artery calcifications are present. Dense calcifications are noted upon the mitral valve annulus and within the aortic leaflet. Thoracic aorta is normal caliber and heavily calcified. Normal 3 vessel branching of the arch. Proximal great vessels including the subclavian arteries heavily calcified as well. Mediastinum/Nodes: No enlarged mediastinal, hilar or axillary lymph nodes. Thyroid gland and thoracic inlet are unremarkable. No acute abnormality of the trachea. Small hiatal hernia. Lungs/Pleura: Mild apical pleuroparenchymal scarring. No consolidation, features of edema, pneumothorax, or effusion. No suspicious pulmonary nodules or masses. Dependent atelectasis in the lung bases. Upper Abdomen: Stable capsular calcification adjacent the gallbladder fossa, external to the gallbladder. No acute abnormalities present in the visualized portions of the upper abdomen. Mild bilateral symmetric perinephric stranding, a nonspecific finding though may correlate with either age or decreased renal function. Musculoskeletal: No acute osseous abnormality or suspicious osseous lesion. Multilevel degenerative changes are present in the imaged portions of the spine. Slight exaggeration of the thoracic kyphosis. Additional degenerative changes noted in the shoulders, right greater than left. Patient is edentulous. Review of the MIP images confirms the above findings. IMPRESSION: 1. No evidence of pulmonary embolism. 2. Borderline enlargement of the central pulmonary arteries, which can be seen with pulmonary arterial hypertension. 3. Aortic  Atherosclerosis (ICD10-I70.0). Electronically Signed   By: Lovena Le M.D.   On: 11/02/2019 16:06   US Venous Img Lower Bilateral  Result Date: 11/02/2019 CLINICAL DATA:  Patient with lower extremity swelling bilaterally. EXAM: BILATERAL LOWER EXTREMITY VENOUS DOPPLER ULTRASOUND TECHNIQUE: Gray-scale sonography with graded compression, as well as color Doppler and duplex ultrasound were performed to evaluate the lower extremity deep venous systems from the level of the common femoral vein and including the common femoral, femoral, profunda femoral, popliteal and calf veins including the posterior tibial, peroneal and gastrocnemius veins when visible.  The superficial great saphenous vein was also interrogated. Spectral Doppler was utilized to evaluate flow at rest and with distal augmentation maneuvers in the common femoral, femoral and popliteal veins. COMPARISON:  None. FINDINGS: RIGHT LOWER EXTREMITY Common Femoral Vein: No evidence of thrombus. Normal compressibility, respiratory phasicity and response to augmentation. Saphenofemoral Junction: No evidence of thrombus. Normal compressibility and flow on color Doppler imaging. Profunda Femoral Vein: No evidence of thrombus. Normal compressibility and flow on color Doppler imaging. Femoral Vein: No evidence of thrombus. Normal compressibility, respiratory phasicity and response to augmentation. Popliteal Vein: No evidence of thrombus. Normal compressibility, respiratory phasicity and response to augmentation. Calf Veins: No evidence of thrombus. Normal compressibility and flow on color Doppler imaging. Superficial Great Saphenous Vein: No evidence of thrombus. Normal compressibility. Venous Reflux:  None. Other Findings:  None. LEFT LOWER EXTREMITY Common Femoral Vein: No evidence of thrombus. Normal compressibility, respiratory phasicity and response to augmentation. Saphenofemoral Junction: No evidence of thrombus. Normal compressibility and flow on color  Doppler imaging. Profunda Femoral Vein: No evidence of thrombus. Normal compressibility and flow on color Doppler imaging. Femoral Vein: No evidence of thrombus. Normal compressibility, respiratory phasicity and response to augmentation. Popliteal Vein: No evidence of thrombus. Normal compressibility, respiratory phasicity and response to augmentation. Calf Veins: No evidence of thrombus. Normal compressibility and flow on color Doppler imaging. Superficial Great Saphenous Vein: No evidence of thrombus. Normal compressibility. Venous Reflux:  None. Other Findings:  None. IMPRESSION: No evidence of deep venous thrombosis in either lower extremity. Electronically Signed   By: Lovey Newcomer M.D.   On: 11/02/2019 16:37    Procedures Procedures (including critical care time)  Medications Ordered in ED Medications  iohexol (OMNIPAQUE) 350 MG/ML injection 100 mL (70 mLs Intravenous Contrast Given 11/02/19 1527)     Initial Impression / Assessment and Plan / ED Course  I have reviewed the triage vital signs and the nursing notes.  Pertinent labs & imaging results that were available during my care of the patient were reviewed by me and considered in my medical decision making (see chart for details).       Patient here for evaluation of one month of shortness of breath, leg swelling as well is elevated D dimer and BNP. She is in no acute distress on ED arrival but noted to be hypertensive. She did take her home medications today. Family report that she has been checking her blood pressure at home and it runs around [id number] systolic. CTA PE protocol as well as vascular ultrasound were obtained. The studies were negative for acute thrombosis. No evidence of pneumonia or decompensated heart failure. Discussed with patient her elevated blood pressure as well as her edema. Discussed option of managing her medications in the emergency department to attempt for more diuresis and patient declines at this time. She  would prefer to contact her cardiologist on Monday for medication adjustments. Discussed importance of outpatient follow-up as well as return precautions.  Final Clinical Impressions(s) / ED Diagnoses   Final diagnoses:  Elevated d-dimer  Chronic congestive heart failure, unspecified heart failure type [facility])    ED Discharge Orders    None       Quintella Reichert, MD 11/02/19 [phone]

## 2019-11-02 NOTE — ED Notes (Addendum)
Patient ambulated to bathroom.

## 2019-11-02 NOTE — ED Triage Notes (Signed)
Sent by PCP d/t abnormal lab results yesterday.

## 2019-11-11 NOTE — Addendum Note (Signed)
Addended by: Particia Nearing B on: 11/11/2019 08:25 AM   Modules accepted: Orders

## 2019-12-24 ENCOUNTER — Ambulatory Visit (INDEPENDENT_AMBULATORY_CARE_PROVIDER_SITE_OTHER): Payer: Medicare Other

## 2019-12-24 ENCOUNTER — Other Ambulatory Visit: Payer: Self-pay

## 2019-12-24 ENCOUNTER — Ambulatory Visit: Payer: Medicare Other

## 2019-12-24 DIAGNOSIS — I10 Essential (primary) hypertension: Secondary | ICD-10-CM

## 2019-12-24 DIAGNOSIS — E7800 Pure hypercholesterolemia, unspecified: Secondary | ICD-10-CM | POA: Diagnosis not present

## 2019-12-24 DIAGNOSIS — I340 Nonrheumatic mitral (valve) insufficiency: Secondary | ICD-10-CM | POA: Diagnosis not present

## 2019-12-24 DIAGNOSIS — R600 Localized edema: Secondary | ICD-10-CM

## 2019-12-24 DIAGNOSIS — R0600 Dyspnea, unspecified: Secondary | ICD-10-CM | POA: Diagnosis not present

## 2019-12-24 NOTE — Progress Notes (Unsigned)
Complete echocardiogram has been performed.  Jimmy Friend Dorfman RDCS, RVT 

## 2019-12-24 NOTE — Addendum Note (Signed)
Addended by: Beckey Rutter on: 12/24/2019 04:10 PM   Modules accepted: Orders

## 2020-01-02 ENCOUNTER — Other Ambulatory Visit: Payer: Self-pay

## 2020-01-02 ENCOUNTER — Ambulatory Visit (INDEPENDENT_AMBULATORY_CARE_PROVIDER_SITE_OTHER): Payer: Medicare Other | Admitting: Cardiology

## 2020-01-02 ENCOUNTER — Encounter: Payer: Self-pay | Admitting: Cardiology

## 2020-01-02 VITALS — BP 188/92 | HR 64 | Ht 62.0 in | Wt 171.0 lb

## 2020-01-02 DIAGNOSIS — R0609 Other forms of dyspnea: Secondary | ICD-10-CM

## 2020-01-02 DIAGNOSIS — E7800 Pure hypercholesterolemia, unspecified: Secondary | ICD-10-CM | POA: Diagnosis not present

## 2020-01-02 DIAGNOSIS — I10 Essential (primary) hypertension: Secondary | ICD-10-CM | POA: Diagnosis not present

## 2020-01-02 DIAGNOSIS — F17210 Nicotine dependence, cigarettes, uncomplicated: Secondary | ICD-10-CM

## 2020-01-02 DIAGNOSIS — R0600 Dyspnea, unspecified: Secondary | ICD-10-CM

## 2020-01-02 DIAGNOSIS — I340 Nonrheumatic mitral (valve) insufficiency: Secondary | ICD-10-CM | POA: Diagnosis not present

## 2020-01-02 NOTE — Addendum Note (Signed)
Addended by: Beckey Rutter on: 01/02/2020 01:54 PM   Modules accepted: Orders

## 2020-01-02 NOTE — Progress Notes (Signed)
Cardiology Office Note:    Date:  01/02/2020   ID:  Tiffany Mitchell, DOB [birth year], MRN [id number]  PCP:  Cyndi Bender, PA-C  Cardiologist:  Jenean Lindau, MD   Referring MD: Cyndi Bender, PA-C    ASSESSMENT:    1. Essential hypertension   2. Moderate mitral insufficiency   3. Pure hypercholesterolemia   4. Cigarette smoker   5. DOE (dyspnea on exertion)    PLAN:    In order of problems listed above:  1. Dyspnea on exertion: I discussed my findings with the patient at extensive length.  Results of the echocardiogram were outlined to her at extensive length.  In view of her symptoms we will do a Lexiscan sestamibi. 2. Essential hypertension: Blood pressure is elevated.  She tells me that it is much better at home and in the systolic range of [id number].  I told her to keep a track of it.  I am not want to pursue this aggressively as it might be an element of whitecoat hypertension.  I feel hypotension and its adverse consequences.  I discussed this with her and she vocalized understanding.  Dietary issues of salt intake and also lifestyle modification was discussed and she promises to do better. 3. Patient will be seen in follow-up appointment in 6 months or earlier if the patient has any concerns    Medication Adjustments/Labs and Tests Ordered: Current medicines are reviewed at length with the patient today.  Concerns regarding medicines are outlined above.  No orders of the defined types were placed in this encounter.  No orders of the defined types were placed in this encounter.    Chief Complaint  Patient presents with  . Follow-up     History of Present Illness:    Tiffany Mitchell is a 86 y.o. female.  Patient has past medical history of essential hypertension and mitral regurgitation.  She denies any problems at this time and takes care of activities of daily living.  She complains of some shortness of breath on exertion.  The last time I saw her we did a  blood test and a D-dimer was positive.  CT scan of the chest ruled out pulmonary embolism and her evaluation of the legs did not reveal any evidence of DVT.  At the time of my evaluation, the patient is alert awake oriented and in no distress.  Past Medical History:  Diagnosis Date  . Bilateral leg edema   . Bilateral leg edema   . Calculus of gallbladder with chronic cholecystitis without obstruction 10/12/2016  . Chronic kidney disease 07/09/2015  . Cigarette smoker 07/09/2015  . CKD (chronic kidney disease)   . COPD (chronic obstructive pulmonary disease) ([place])   . Cystocele 02/12/2016  . Diastolic dysfunction   . Eczema   . Essential hypertension 07/09/2015  . Gout   . Hypercalcemia   . Hyperlipidemia   . Lumbar pain with radiation down right leg   . Lumbar pain with radiation down right leg   . Moderate mitral insufficiency 07/09/2015  . Osteoarthritis of left knee   . Osteopenia   . Pain in both upper extremities   . Peptic ulcer disease   . Personal history of transient ischemic attack (TIA), and cerebral infarction without residual deficits 07/09/2015  . Postoperative examination 02/12/2016  . Pure hypercholesterolemia 07/09/2015  . Stress incontinence in female 02/12/2016  . Vitamin D deficiency     Past Surgical History:  Procedure Laterality Date  . ABDOMINAL HYSTERECTOMY    .  APPENDECTOMY    . KIDNEY SURGERY    . OVARIAN CYST SURGERY    . TUBAL LIGATION      Current Medications: Current Meds  Medication Sig  . acetaminophen (TYLENOL) 650 MG CR tablet Take 650 mg by mouth every 8 (eight) hours as needed for pain.  Marland Kitchen allopurinol (ZYLOPRIM) 100 MG tablet Take 100 mg by mouth daily.  Marland Kitchen aspirin 81 MG chewable tablet Chew 81 mg by mouth 2 (two) times daily.   . carvedilol (COREG) 12.5 MG tablet 12.5 mg 2 (two) times daily.  . Cholecalciferol (VITAMIN D3) 50 MCG (2000 UT) capsule Take by mouth.  . clobetasol cream (TEMOVATE) 0.05 % Apply topically.  . conjugated  estrogens (PREMARIN) vaginal cream Place vaginally.  . doxazosin (CARDURA) 8 MG tablet Take 8 mg by mouth at bedtime.  . enalapril (VASOTEC) 20 MG tablet Take 20 mg by mouth 2 (two) times daily.  . Oxyquinoline-Sod Lauryl Sulf (TRIMO-SAN) 0.025-0.01 % GEL Apply vaginally as needed  . pantoprazole (PROTONIX) 40 MG tablet Take 40 mg by mouth daily.  . pravastatin (PRAVACHOL) 40 MG tablet Take 40 mg by mouth at bedtime.  . Prenatal Vit-Fe Fumarate-FA (PNV PRENATAL PLUS MULTIVITAMIN) 27-1 MG TABS Take by mouth.  . tiotropium (SPIRIVA) 18 MCG inhalation capsule Place into inhaler and inhale.  . torsemide (DEMADEX) 20 MG tablet Take 20 mg by mouth every morning.  . triamcinolone cream (KENALOG) 0.1 %   . vitamin B-12 (CYANOCOBALAMIN) 100 MCG tablet Take 100 mcg by mouth daily.     Allergies:   Amlodipine and Hydrocodone-acetaminophen   Social History   Socioeconomic History  . Marital status: Single    Spouse name: Not on file  . Number of children: Not on file  . Years of education: Not on file  . Highest education level: Not on file  Occupational History  . Not on file  Tobacco Use  . Smoking status: Current Some Day Smoker    Packs/day: 0.25    Types: Cigarettes  . Smokeless tobacco: Never Used  Substance and Sexual Activity  . Alcohol use: Never  . Drug use: Never  . Sexual activity: Not on file  Other Topics Concern  . Not on file  Social History Narrative  . Not on file   Social Determinants of Health   Financial Resource Strain:   . Difficulty of Paying Living Expenses: Not on file  Food Insecurity:   . Worried About Charity fundraiser in the Last Year: Not on file  . Ran Out of Food in the Last Year: Not on file  Transportation Needs:   . Lack of Transportation (Medical): Not on file  . Lack of Transportation (Non-Medical): Not on file  Physical Activity:   . Days of Exercise per Week: Not on file  . Minutes of Exercise per Session: Not on file  Stress:   .  Feeling of Stress : Not on file  Social Connections:   . Frequency of Communication with Friends and Family: Not on file  . Frequency of Social Gatherings with Friends and Family: Not on file  . Attends Religious Services: Not on file  . Active Member of Clubs or Organizations: Not on file  . Attends Archivist Meetings: Not on file  . Marital Status: Not on file     Family History: The patient's family history includes Diabetes in her sister.  ROS:   Please see the history of present illness.    All  other systems reviewed and are negative.  EKGs/Labs/Other Studies Reviewed:    The following studies were reviewed today: CT Angio Chest PE W/Cm &/Or Wo Cm (Accession [id number]) (Order [id number]) Imaging Date: 11/02/2019 Department: [facility] Released By/Authorizing: Quintella Reichert, MD (auto-released)  Exam Status  Status  Final [99]  PACS Intelerad Image Link  Show images for CT Angio Chest PE W/Cm &/Or Wo Cm  Study Result  CLINICAL DATA:  PE suspected, intermediate probability, positive D-dimer, history of COPD and current smoking  EXAM: CT ANGIOGRAPHY CHEST WITH CONTRAST  TECHNIQUE: Multidetector CT imaging of the chest was performed using the standard protocol during bolus administration of intravenous contrast. Multiplanar CT image reconstructions and MIPs were obtained to evaluate the vascular anatomy.  CONTRAST:  45mL OMNIPAQUE IOHEXOL 350 MG/ML SOLN  COMPARISON:  CT enterography 10/04/2016  FINDINGS: Cardiovascular: Satisfactory opacification the pulmonary arteries to the segmental level. No pulmonary artery filling defects are identified. Borderline enlargement of the central pulmonary arteries without elevation of the RV/LV ratio (0.78). Cardiac size at the upper limits of normal. Coronary artery calcifications are present. Dense calcifications are noted upon the mitral valve annulus and within the aortic  leaflet. Thoracic aorta is normal caliber and heavily calcified. Normal 3 vessel branching of the arch. Proximal great vessels including the subclavian arteries heavily calcified as well.  Mediastinum/Nodes: No enlarged mediastinal, hilar or axillary lymph nodes. Thyroid gland and thoracic inlet are unremarkable. No acute abnormality of the trachea. Small hiatal hernia.  Lungs/Pleura: Mild apical pleuroparenchymal scarring. No consolidation, features of edema, pneumothorax, or effusion. No suspicious pulmonary nodules or masses. Dependent atelectasis in the lung bases.  Upper Abdomen: Stable capsular calcification adjacent the gallbladder fossa, external to the gallbladder. No acute abnormalities present in the visualized portions of the upper abdomen. Mild bilateral symmetric perinephric stranding, a nonspecific finding though may correlate with either age or decreased renal function.  Musculoskeletal: No acute osseous abnormality or suspicious osseous lesion. Multilevel degenerative changes are present in the imaged portions of the spine. Slight exaggeration of the thoracic kyphosis. Additional degenerative changes noted in the shoulders, right greater than left. Patient is edentulous.  Review of the MIP images confirms the above findings.  IMPRESSION: 1. No evidence of pulmonary embolism. 2. Borderline enlargement of the central pulmonary arteries, which can be seen with pulmonary arterial hypertension. 3. Aortic Atherosclerosis (ICD10-I70.0).   Electronically Signed   By: Lovena Le M.D.   On: 11/02/2019 16:06      Recent Labs: 11/01/2019: ALT 8; BUN 14; Creatinine, Ser 0.89; Hemoglobin 11.2; NT-Pro BNP 1,014; Platelets 222; Potassium 4.0; Sodium 147; TSH 2.560  Recent Lipid Panel No results found for: CHOL, TRIG, HDL, CHOLHDL, VLDL, LDLCALC, LDLDIRECT  Physical Exam:    VS:  BP (!) 188/92   Pulse 64   Ht 5\' 2"  (1.575 m)   Wt 171 lb (77.6 kg)   SpO2  97%   BMI 31.28 kg/m     Wt Readings from Last 3 Encounters:  01/02/20 171 lb (77.6 kg)  11/02/19 169 lb 3.2 oz (76.7 kg)  11/01/19 168 lb 12.8 oz (76.6 kg)     GEN: Patient is in no acute distress HEENT: Normal NECK: No JVD; No carotid bruits LYMPHATICS: No lymphadenopathy CARDIAC: Hear sounds regular, 2/6 systolic murmur at the apex. RESPIRATORY:  Clear to auscultation without rales, wheezing or rhonchi  ABDOMEN: Soft, non-tender, non-distended MUSCULOSKELETAL:  No edema; No deformity  SKIN: Warm and dry NEUROLOGIC:  Alert and  oriented x 3 PSYCHIATRIC:  Normal affect   Signed, Jenean Lindau, MD  01/02/2020 1:36 PM     [facility]

## 2020-01-02 NOTE — Patient Instructions (Signed)
Medication Instructions:  Your physician recommends that you continue on your current medications as directed. Please refer to the Current Medication list given to you today.  *If you need a refill on your cardiac medications before your next appointment, please call your pharmacy*  Lab Work: NONE If you have labs (blood work) drawn today and your tests are completely normal, you will receive your results only by: Marland Kitchen MyChart Message (if you have MyChart) OR . A paper copy in the mail If you have any lab test that is abnormal or we need to change your treatment, we will call you to review the results.  Testing/Procedures: Your physician has requested that you have a lexiscan myoview. For further information please visit [email or web address]. Please follow instruction sheet, as given.    Follow-Up: At [facility], you and your health needs are our priority.  As part of our continuing mission to provide you with exceptional heart care, we have created designated Provider Care Teams.  These Care Teams include your primary Cardiologist (physician) and Advanced Practice Providers (APPs -  Physician Assistants and Nurse Practitioners) who all work together to provide you with the care you need, when you need it.  Your next appointment:   2 month(s)  The format for your next appointment:   In Person  Provider:   Jyl Heinz, MD  Other Instructions Regadenoson injection What is this medicine? REGADENOSON is used to test the heart for coronary artery disease. It is used in patients who can not exercise for their stress test. This medicine may be used for other purposes; ask your health care provider or pharmacist if you have questions. COMMON BRAND NAME(S): Lexiscan What should I tell my health care provider before I take this medicine? They need to know if you have any of these conditions:  heart problems  lung or breathing disease, like asthma or COPD  an unusual or allergic  reaction to regadenoson, other medicines, foods, dyes, or preservatives  pregnant or trying to get pregnant  breast-feeding How should I use this medicine? This medicine is for injection into a vein. It is given by a health care professional in a hospital or clinic setting. Talk to your pediatrician regarding the use of this medicine in children. Special care may be needed. Overdosage: If you think you have taken too much of this medicine contact a poison control center or emergency room at once. NOTE: This medicine is only for you. Do not share this medicine with others. What if I miss a dose? This does not apply. What may interact with this medicine?  caffeine  dipyridamole  guarana  theophylline This list may not describe all possible interactions. Give your health care provider a list of all the medicines, herbs, non-prescription drugs, or dietary supplements you use. Also tell them if you smoke, drink alcohol, or use illegal drugs. Some items may interact with your medicine. What should I watch for while using this medicine? Your condition will be monitored carefully while you are receiving this medicine. Do not take medicines, foods, or drinks with caffeine (like coffee, tea, or colas) for at least 12 hours before your test. If you do not know if something contains caffeine, ask your health care professional. What side effects may I notice from receiving this medicine? Side effects that you should report to your doctor or health care professional as soon as possible:  allergic reactions like skin rash, itching or hives, swelling of the face, lips, or tongue  breathing  problems  chest pain, tightness or palpitations  severe headache Side effects that usually do not require medical attention (report to your doctor or health care professional if they continue or are bothersome):  flushing  headache  irritation or pain at site where injected  nausea, vomiting This list  may not describe all possible side effects. Call your doctor for medical advice about side effects. You may report side effects to FDA at 1-800-FDA-1088. Where should I keep my medicine? This drug is given in a hospital or clinic and will not be stored at home. NOTE: This sheet is a summary. It may not cover all possible information. If you have questions about this medicine, talk to your doctor, pharmacist, or health care provider.  2020 Elsevier/Gold Standard (2008-08-11 15:08:13)  Cardiac Nuclear Scan A cardiac nuclear scan is a test that measures blood flow to the heart when a person is resting and when he or she is exercising. The test looks for problems such as:  Not enough blood reaching a portion of the heart.  The heart muscle not working normally. You may need this test if:  You have heart disease.  You have had abnormal lab results.  You have had heart surgery or a balloon procedure to open up blocked arteries (angioplasty).  You have chest pain.  You have shortness of breath. In this test, a radioactive dye (tracer) is injected into your bloodstream. After the tracer has traveled to your heart, an imaging device is used to measure how much of the tracer is absorbed by or distributed to various areas of your heart. This procedure is usually done at a hospital and takes 2-4 hours. Tell a health care provider about:  Any allergies you have.  All medicines you are taking, including vitamins, herbs, eye drops, creams, and over-the-counter medicines.  Any problems you or family members have had with anesthetic medicines.  Any blood disorders you have.  Any surgeries you have had.  Any medical conditions you have.  Whether you are pregnant or may be pregnant. What are the risks? Generally, this is a safe procedure. However, problems may occur, including:  Serious chest pain and heart attack. This is only a risk if the stress portion of the test is done.  Rapid  heartbeat.  Sensation of warmth in your chest. This usually passes quickly.  Allergic reaction to the tracer. What happens before the procedure?  Ask your health care provider about changing or stopping your regular medicines. This is especially important if you are taking diabetes medicines or blood thinners.  Follow instructions from your health care provider about eating or drinking restrictions.  Remove your jewelry on the day of the procedure. What happens during the procedure?  An IV will be inserted into one of your veins.  Your health care provider will inject a small amount of radioactive tracer through the IV.  You will wait for 20-40 minutes while the tracer travels through your bloodstream.  Your heart activity will be monitored with an electrocardiogram (ECG).  You will lie down on an exam table.  Images of your heart will be taken for about 15-20 minutes.  You may also have a stress test. For this test, one of the following may be done: ? You will exercise on a treadmill or stationary bike. While you exercise, your heart's activity will be monitored with an ECG, and your blood pressure will be checked. ? You will be given medicines that will increase blood flow to  parts of your heart. This is done if you are unable to exercise.  When blood flow to your heart has peaked, a tracer will again be injected through the IV.  After 20-40 minutes, you will get back on the exam table and have more images taken of your heart.  Depending on the type of tracer used, scans may need to be repeated 3-4 hours later.  Your IV line will be removed when the procedure is over. The procedure may vary among health care providers and hospitals. What happens after the procedure?  Unless your health care provider tells you otherwise, you may return to your normal schedule, including diet, activities, and medicines.  Unless your health care provider tells you otherwise, you may increase  your fluid intake. This will help to flush the contrast dye from your body. Drink enough fluid to keep your urine pale yellow.  Ask your health care provider, or the department that is doing the test: ? When will my results be ready? ? How will I get my results? Summary  A cardiac nuclear scan measures the blood flow to the heart when a person is resting and when he or she is exercising.  Tell your health care provider if you are pregnant.  Before the procedure, ask your health care provider about changing or stopping your regular medicines. This is especially important if you are taking diabetes medicines or blood thinners.  After the procedure, unless your health care provider tells you otherwise, increase your fluid intake. This will help flush the contrast dye from your body.  After the procedure, unless your health care provider tells you otherwise, you may return to your normal schedule, including diet, activities, and medicines. This information is not intended to replace advice given to you by your health care provider. Make sure you discuss any questions you have with your health care provider. Document Revised: 05/28/2018 Document Reviewed: 05/28/2018 Elsevier Patient Education  [place].

## 2020-01-08 ENCOUNTER — Encounter: Payer: Self-pay | Admitting: *Deleted

## 2020-01-15 ENCOUNTER — Telehealth: Payer: Self-pay | Admitting: *Deleted

## 2020-01-15 NOTE — Telephone Encounter (Signed)
Patient given detailed instructions per Myocardial Perfusion Study Information Sheet for the test on 01/22/2020 at 0745. Patient notified to arrive 15 minutes early and that it is imperative to arrive on time for appointment to keep from having the test rescheduled.  If you need to cancel or reschedule your appointment, please call the office within 24 hours of your appointment. . Patient verbalized understanding.Chanley Mcenery, Ranae Palms

## 2020-01-22 ENCOUNTER — Encounter: Payer: Self-pay | Admitting: Cardiology

## 2020-01-22 ENCOUNTER — Ambulatory Visit: Payer: Medicare Other

## 2020-01-22 ENCOUNTER — Other Ambulatory Visit: Payer: Self-pay

## 2020-01-22 ENCOUNTER — Ambulatory Visit (INDEPENDENT_AMBULATORY_CARE_PROVIDER_SITE_OTHER): Payer: Medicare Other | Admitting: Cardiology

## 2020-01-22 VITALS — Ht 62.0 in | Wt 171.0 lb

## 2020-01-22 VITALS — BP 128/74 | HR 124 | Ht 62.0 in | Wt 172.8 lb

## 2020-01-22 DIAGNOSIS — E7800 Pure hypercholesterolemia, unspecified: Secondary | ICD-10-CM

## 2020-01-22 DIAGNOSIS — I340 Nonrheumatic mitral (valve) insufficiency: Secondary | ICD-10-CM | POA: Diagnosis not present

## 2020-01-22 DIAGNOSIS — I480 Paroxysmal atrial fibrillation: Secondary | ICD-10-CM

## 2020-01-22 DIAGNOSIS — Z9229 Personal history of other drug therapy: Secondary | ICD-10-CM | POA: Diagnosis not present

## 2020-01-22 DIAGNOSIS — F17210 Nicotine dependence, cigarettes, uncomplicated: Secondary | ICD-10-CM

## 2020-01-22 DIAGNOSIS — I10 Essential (primary) hypertension: Secondary | ICD-10-CM

## 2020-01-22 HISTORY — DX: Paroxysmal atrial fibrillation: I48.0

## 2020-01-22 MED ORDER — APIXABAN 5 MG PO TABS: 5.0000 mg | ORAL_TABLET | Freq: Two times a day (BID) | ORAL | 3 refills | Status: DC

## 2020-01-22 MED ORDER — TECHNETIUM TC 99M TETROFOSMIN IV KIT
10.2000 | PACK | Freq: Once | INTRAVENOUS | Status: AC | PRN
  Administered 2020-01-22: 10.2 via INTRAVENOUS

## 2020-01-22 NOTE — Progress Notes (Signed)
Cardiology Office Note:    Date:  01/22/2020   ID:  Tiffany Mitchell, DOB January 21, 1930, MRN [id number]  PCP:  Cyndi Bender, PA-C  Cardiologist:  Jenean Lindau, MD   Referring MD: Cyndi Bender, PA-C    ASSESSMENT:    1. Hx of long-term (current) use of anticoagulants   2. PAF (paroxysmal atrial fibrillation) ([place])   3. Essential hypertension   4. Moderate mitral insufficiency   5. Pure hypercholesterolemia   6. Cigarette smoker    PLAN:    In order of problems listed above:  1. Paroxysmal atrial fibrillation: This is newly diagnosed:I discussed with the patient atrial fibrillation, disease process. Management and therapy including rate and rhythm control, anticoagulation benefits and potential risks were discussed extensively with the patient. Patient had multiple questions which were answered to patient's satisfaction. 2. I think the stress of coming today for the test partly put her into atrial fibrillation.  I think she is going to go back into sinus rhythm or her rates are better controlled even as I see her now.  When I saw her her heart rate was about 85.  When she goes home and relaxes it will be better.  She is on beta-blocker therapy anyway.  I do not want to push it to the point that she gets with low blood pressure bradycardia or any such issues.  She is agreeable for anticoagulation and I have initiated on Eliquis 5 mg twice daily.  She will be back in 3 weeks.  We will check her for occult blood in the stools. 3. Essential hypertension: Blood pressure is stable 4. Mitral insufficiency: I discussed this with her again.  Her blood pressure is stable. 5. Cigarette smoker: I counseled against smoking and she plans to quit in the near future.  She is cutting down significantly. 6. She knows to go to the nearest emergency room for any concerning symptoms   Medication Adjustments/Labs and Tests Ordered: Current medicines are reviewed at length with the patient today.   Concerns regarding medicines are outlined above.  Orders Placed This Encounter  Procedures  . Fecal occult blood, imunochemical([facility])   Meds ordered this encounter  Medications  . apixaban (ELIQUIS) 5 MG TABS tablet    Sig: Take 1 tablet (5 mg total) by mouth 2 (two) times daily.    Dispense:  60 tablet    Refill:  3     Chief Complaint  Patient presents with  . Follow-up     History of Present Illness:    Tiffany Mitchell is a 86 y.o. female.  Patient has past medical history of essential hypertension and smoking.  She was evaluated for dyspnea on exertion.  Her echocardiogram revealed preserved systolic function and mitral regurgitation.  She came in today for a stress test and was newly found to have atrial fibrillation.  She is presently asymptomatic.  Her heart rate was elevated.  At the time of my evaluation, the patient is alert awake oriented and in no distress.  Past Medical History:  Diagnosis Date  . Bilateral leg edema   . Bilateral leg edema   . Calculus of gallbladder with chronic cholecystitis without obstruction 10/12/2016  . Chronic kidney disease 07/09/2015  . Cigarette smoker 07/09/2015  . CKD (chronic kidney disease)   . COPD (chronic obstructive pulmonary disease) ([place])   . Cystocele 02/12/2016  . Diastolic dysfunction   . Eczema   . Essential hypertension 07/09/2015  . Gout   . Hypercalcemia   .  Hyperlipidemia   . Lumbar pain with radiation down right leg   . Lumbar pain with radiation down right leg   . Moderate mitral insufficiency 07/09/2015  . Osteoarthritis of left knee   . Osteopenia   . Pain in both upper extremities   . Peptic ulcer disease   . Personal history of transient ischemic attack (TIA), and cerebral infarction without residual deficits 07/09/2015  . Postoperative examination 02/12/2016  . Pure hypercholesterolemia 07/09/2015  . Stress incontinence in female 02/12/2016  . Vitamin D deficiency     Past Surgical History:    Procedure Laterality Date  . ABDOMINAL HYSTERECTOMY    . APPENDECTOMY    . KIDNEY SURGERY    . OVARIAN CYST SURGERY    . TUBAL LIGATION      Current Medications: Current Meds  Medication Sig  . acetaminophen (TYLENOL) 650 MG CR tablet Take 650 mg by mouth every 8 (eight) hours as needed for pain.  Marland Kitchen allopurinol (ZYLOPRIM) 100 MG tablet Take 100 mg by mouth daily.  . carvedilol (COREG) 12.5 MG tablet 12.5 mg 2 (two) times daily.  . Cholecalciferol (VITAMIN D3) 50 MCG (2000 UT) capsule Take by mouth.  . clobetasol cream (TEMOVATE) 0.05 % Apply topically.  . conjugated estrogens (PREMARIN) vaginal cream Place vaginally.  . doxazosin (CARDURA) 8 MG tablet Take 8 mg by mouth at bedtime.  . enalapril (VASOTEC) 20 MG tablet Take 20 mg by mouth 2 (two) times daily.  . Oxyquinoline-Sod Lauryl Sulf (TRIMO-SAN) 0.025-0.01 % GEL Apply vaginally as needed  . pantoprazole (PROTONIX) 40 MG tablet Take 40 mg by mouth daily.  . pravastatin (PRAVACHOL) 40 MG tablet Take 40 mg by mouth at bedtime.  . Prenatal Vit-Fe Fumarate-FA (PNV PRENATAL PLUS MULTIVITAMIN) 27-1 MG TABS Take by mouth.  . tiotropium (SPIRIVA) 18 MCG inhalation capsule Place into inhaler and inhale.  . torsemide (DEMADEX) 20 MG tablet Take 20 mg by mouth every morning.  . triamcinolone cream (KENALOG) 0.1 %   . vitamin B-12 (CYANOCOBALAMIN) 100 MCG tablet Take 100 mcg by mouth daily.  . [DISCONTINUED] aspirin 81 MG chewable tablet Chew 81 mg by mouth 2 (two) times daily.      Allergies:   Amlodipine and Hydrocodone-acetaminophen   Social History   Socioeconomic History  . Marital status: Single    Spouse name: Not on file  . Number of children: Not on file  . Years of education: Not on file  . Highest education level: Not on file  Occupational History  . Not on file  Tobacco Use  . Smoking status: Current Some Day Smoker    Packs/day: 0.25    Types: Cigarettes  . Smokeless tobacco: Never Used  Substance and Sexual  Activity  . Alcohol use: Never  . Drug use: Never  . Sexual activity: Not on file  Other Topics Concern  . Not on file  Social History Narrative  . Not on file   Social Determinants of Health   Financial Resource Strain:   . Difficulty of Paying Living Expenses: Not on file  Food Insecurity:   . Worried About Charity fundraiser in the Last Year: Not on file  . Ran Out of Food in the Last Year: Not on file  Transportation Needs:   . Lack of Transportation (Medical): Not on file  . Lack of Transportation (Non-Medical): Not on file  Physical Activity:   . Days of Exercise per Week: Not on file  . Minutes of Exercise  per Session: Not on file  Stress:   . Feeling of Stress : Not on file  Social Connections:   . Frequency of Communication with Friends and Family: Not on file  . Frequency of Social Gatherings with Friends and Family: Not on file  . Attends Religious Services: Not on file  . Active Member of Clubs or Organizations: Not on file  . Attends Archivist Meetings: Not on file  . Marital Status: Not on file     Family History: The patient's family history includes Diabetes in her sister.  ROS:   Please see the history of present illness.    All other systems reviewed and are negative.  EKGs/Labs/Other Studies Reviewed:    The following studies were reviewed today: IMPRESSIONS    1. Left ventricular ejection fraction, by visual estimation, is 55 to 60%. The left ventricle has normal function. There is moderately increased left ventricular hypertrophy.  2. The left ventricle has no regional wall motion abnormalities.  3. Left ventricular diastolic parameters are consistent with Grade II diastolic dysfunction (pseudonormalization).  4. Global right ventricle has normal systolic function.The right ventricular size is normal. No increase in right ventricular wall thickness.  5. Left atrial size was severely dilated.  6. Right atrial size was moderately  dilated.  7. The mitral valve is abnormal with severe posterior mitral annular calcification. Moderate mitral valve regurgitation. No evidence of mitral stenosis.  8. The tricuspid valve is normal in structure. Trivial Tricuspid Regurgitation.  9. The aortic valve leaflets are thickened and mildly calcified. Mild-Moderate aortic stenosis. Aortic valve regurgitation is not visualized. 10. The pulmonic valve was normal in structure. Pulmonic valve regurgitation is trivial. 11. Mild Pulmonary Hypertension. Mildly elevated pulmonary artery systolic pressure, 38 mmHG. 12. No prior study for comparision.   Recent Labs: 11/01/2019: ALT 8; BUN 14; Creatinine, Ser 0.89; Hemoglobin 11.2; NT-Pro BNP 1,014; Platelets 222; Potassium 4.0; Sodium 147; TSH 2.560  Recent Lipid Panel No results found for: CHOL, TRIG, HDL, CHOLHDL, VLDL, LDLCALC, LDLDIRECT  Physical Exam:    VS:  BP 128/74 (BP Location: Left Arm, Patient Position: Sitting, Cuff Size: Normal)   Pulse (!) 124   Ht 5\' 2"  (1.575 m)   Wt 172 lb 12.8 oz (78.4 kg)   SpO2 92%   BMI 31.61 kg/m     Wt Readings from Last 3 Encounters:  01/22/20 172 lb 12.8 oz (78.4 kg)  01/22/20 171 lb (77.6 kg)  01/02/20 171 lb (77.6 kg)     GEN: Patient is in no acute distress HEENT: Normal NECK: No JVD; No carotid bruits LYMPHATICS: No lymphadenopathy CARDIAC: Hear sounds regular, 2/6 systolic murmur at the apex. RESPIRATORY:  Clear to auscultation without rales, wheezing or rhonchi  ABDOMEN: Soft, non-tender, non-distended MUSCULOSKELETAL:  No edema; No deformity  SKIN: Warm and dry NEUROLOGIC:  Alert and oriented x 3 PSYCHIATRIC:  Normal affect   Signed, Jenean Lindau, MD  01/22/2020 12:06 PM    [place]

## 2020-01-22 NOTE — Patient Instructions (Signed)
Medication Instructions:  Your physician has recommended you make the following change in your medication:  STOP taking aspirin   START taking Eliquis 5 mg (1 tablet) twice daily  *If you need a refill on your cardiac medications before your next appointment, please call your pharmacy*  Lab Work: You are being sent home with IFOB kit. Please collect sample morning of next visit and bring back with you.  If you have labs (blood work) drawn today and your tests are completely normal, you will receive your results only by: Marland Kitchen MyChart Message (if you have MyChart) OR . A paper copy in the mail If you have any lab test that is abnormal or we need to change your treatment, we will call you to review the results.  Testing/Procedures: NONE  Follow-Up: At [facility], you and your health needs are our priority.  As part of our continuing mission to provide you with exceptional heart care, we have created designated Provider Care Teams.  These Care Teams include your primary Cardiologist (physician) and Advanced Practice Providers (APPs -  Physician Assistants and Nurse Practitioners) who all work together to provide you with the care you need, when you need it.  Your next appointment:   3 week(s)  The format for your next appointment:   In Person  Provider:   Jyl Heinz, MD  Other Instructions Apixaban oral tablets What is this medicine? APIXABAN (a PIX a ban) is an anticoagulant (blood thinner). It is used to lower the chance of stroke in people with a medical condition called atrial fibrillation. It is also used to treat or prevent blood clots in the lungs or in the veins. This medicine may be used for other purposes; ask your health care provider or pharmacist if you have questions. COMMON BRAND NAME(S): Eliquis What should I tell my health care provider before I take this medicine? They need to know if you have any of these conditions:  antiphospholipid antibody  syndrome  bleeding disorders  bleeding in the brain  blood in your stools (black or tarry stools) or if you have blood in your vomit  history of blood clots  history of stomach bleeding  kidney disease  liver disease  mechanical heart valve  an unusual or allergic reaction to apixaban, other medicines, foods, dyes, or preservatives  pregnant or trying to get pregnant  breast-feeding How should I use this medicine? Take this medicine by mouth with a glass of water. Follow the directions on the prescription label. You can take it with or without food. If it upsets your stomach, take it with food. Take your medicine at regular intervals. Do not take it more often than directed. Do not stop taking except on your doctor's advice. Stopping this medicine may increase your risk of a blood clot. Be sure to refill your prescription before you run out of medicine. Talk to your pediatrician regarding the use of this medicine in children. Special care may be needed. Overdosage: If you think you have taken too much of this medicine contact a poison control center or emergency room at once. NOTE: This medicine is only for you. Do not share this medicine with others. What if I miss a dose? If you miss a dose, take it as soon as you can. If it is almost time for your next dose, take only that dose. Do not take double or extra doses. What may interact with this medicine? This medicine may interact with the following:  aspirin and  aspirin-like medicines  certain medicines for fungal infections like ketoconazole and itraconazole  certain medicines for seizures like carbamazepine and phenytoin  certain medicines that treat or prevent blood clots like warfarin, enoxaparin, and dalteparin  clarithromycin  NSAIDs, medicines for pain and inflammation, like ibuprofen or naproxen  rifampin  ritonavir  St. John's wort This list may not describe all possible interactions. Give your health care  provider a list of all the medicines, herbs, non-prescription drugs, or dietary supplements you use. Also tell them if you smoke, drink alcohol, or use illegal drugs. Some items may interact with your medicine. What should I watch for while using this medicine? Visit your healthcare professional for regular checks on your progress. You may need blood work done while you are taking this medicine. Your condition will be monitored carefully while you are receiving this medicine. It is important not to miss any appointments. Avoid sports and activities that might cause injury while you are using this medicine. Severe falls or injuries can cause unseen bleeding. Be careful when using sharp tools or knives. Consider using an Copy. Take special care brushing or flossing your teeth. Report any injuries, bruising, or red spots on the skin to your healthcare professional. If you are going to need surgery or other procedure, tell your healthcare professional that you are taking this medicine. Wear a medical ID bracelet or chain. Carry a card that describes your disease and details of your medicine and dosage times. What side effects may I notice from receiving this medicine? Side effects that you should report to your doctor or health care professional as soon as possible:  allergic reactions like skin rash, itching or hives, swelling of the face, lips, or tongue  signs and symptoms of bleeding such as bloody or black, tarry stools; red or dark-brown urine; spitting up blood or brown material that looks like coffee grounds; red spots on the skin; unusual bruising or bleeding from the eye, gums, or nose  signs and symptoms of a blood clot such as chest pain; shortness of breath; pain, swelling, or warmth in the leg  signs and symptoms of a stroke such as changes in vision; confusion; trouble speaking or understanding; severe headaches; sudden numbness or weakness of the face, arm or leg; trouble  walking; dizziness; loss of coordination This list may not describe all possible side effects. Call your doctor for medical advice about side effects. You may report side effects to FDA at 1-800-FDA-1088. Where should I keep my medicine? Keep out of the reach of children. Store at room temperature between 20 and 25 degrees C (68 and 77 degrees F). Throw away any unused medicine after the expiration date. NOTE: This sheet is a summary. It may not cover all possible information. If you have questions about this medicine, talk to your doctor, pharmacist, or health care provider.  2020 Elsevier/Gold Standard (2018-08-22 17:39:34)

## 2020-01-23 NOTE — Addendum Note (Signed)
Addended by: Beckey Rutter on: 01/23/2020 12:28 PM   Modules accepted: Orders

## 2020-02-05 ENCOUNTER — Telehealth: Payer: Self-pay | Admitting: Cardiology

## 2020-02-05 NOTE — Telephone Encounter (Signed)
Pt c/o medication issue:  1. Name of Medication: apixaban (ELIQUIS) 5 MG TABS tablet  2. How are you currently taking this medication (dosage and times per day)? Not sure  3. Are you having a reaction (difficulty breathing--STAT)? no  4. What is your medication issue? Patient's daughter Tye [place] calling stating since the patient has been on the medication she has not been herself. She says she has been very disoriented and has been having swelling in the arch of her foot and can barely stand. She says she has also been having some SOB, but was not currently with the patient. Please advise.

## 2020-02-05 NOTE — Telephone Encounter (Signed)
[id number] 

## 2020-02-10 NOTE — Telephone Encounter (Signed)
Left message for daughter of recommendations and encouraged her to take twice daily.

## 2020-02-10 NOTE — Telephone Encounter (Signed)
Left message for pt to call. Left message for pt daughter to call

## 2020-02-10 NOTE — Telephone Encounter (Signed)
For now the only advice is to recommend she take her Eliquis until she is seen on February 19, 2020.

## 2020-02-10 NOTE — Telephone Encounter (Signed)
Spoke with pt daughter, she reports that after starting the eliquis the patient became very confused and disoriented. She reports that the swelling she had in her feet and ankles went into the arch and toes. She also complained of her legs hurting. She reports a nurse that comes to help another family member checked her oxygen and it was down into the 50's and they made her put her oxygen on and it came back up to 97%. The daughter reports she did not taker the elqiuis for 24 hours and the confusion was better. The daughter reports the patient is only taking it once daily now. Explained the reason for the medication and that she is not protected from stroke just taking the eliquis once daily. They would like her to be switched to a different medication. Follow up scheduled 02/19/20. Will forward for review and advise

## 2020-02-19 ENCOUNTER — Ambulatory Visit (INDEPENDENT_AMBULATORY_CARE_PROVIDER_SITE_OTHER): Payer: Medicare Other | Admitting: Cardiology

## 2020-02-19 ENCOUNTER — Other Ambulatory Visit: Payer: Self-pay

## 2020-02-19 ENCOUNTER — Encounter: Payer: Self-pay | Admitting: Cardiology

## 2020-02-19 VITALS — BP 112/66 | HR 106 | Ht 62.0 in | Wt 179.0 lb

## 2020-02-19 DIAGNOSIS — F17210 Nicotine dependence, cigarettes, uncomplicated: Secondary | ICD-10-CM | POA: Diagnosis not present

## 2020-02-19 DIAGNOSIS — I340 Nonrheumatic mitral (valve) insufficiency: Secondary | ICD-10-CM | POA: Diagnosis not present

## 2020-02-19 DIAGNOSIS — I10 Essential (primary) hypertension: Secondary | ICD-10-CM

## 2020-02-19 DIAGNOSIS — I480 Paroxysmal atrial fibrillation: Secondary | ICD-10-CM | POA: Diagnosis not present

## 2020-02-19 MED ORDER — DIGOXIN 125 MCG PO TABS: 0.1250 mg | ORAL_TABLET | Freq: Every day | ORAL | 6 refills | Status: DC

## 2020-02-19 NOTE — Progress Notes (Signed)
Cardiology Office Note:    Date:  02/19/2020   ID:  Tiffany Mitchell, DOB 09-Jul-1930, MRN [id number]  PCP:  Cyndi Bender, PA-C  Cardiologist:  Jenean Lindau, MD    Referring MD: Cyndi Bender, PA-C    ASSESSMENT:    1. PAF (paroxysmal atrial fibrillation) ([place])   2. Moderate mitral insufficiency   3. Essential hypertension   4. Cigarette smoker    PLAN:    In order of problems listed above:  1. Atrial fibrillation: Newly diagnosed:I discussed with the patient atrial fibrillation, disease process. Management and therapy including rate and rhythm control, anticoagulation benefits and potential risks were discussed extensively with the patient. Patient had multiple questions which were answered to patient's satisfaction.  Her heart rate is elevated and I am to start her on digoxin 0.125 mg daily.  Benefits and potential is explained and she vocalized understanding she will have a Chem-7 today.  She will be seen in follow-up appointment in 2 weeks or earlier if she has any concerns. 2. Cigarette smoker: She is quit smoking and I congratulated her for this. 3. She knows to go to the nearest emergency room for any concerning symptoms.   Medication Adjustments/Labs and Tests Ordered: Current medicines are reviewed at length with the patient today.  Concerns regarding medicines are outlined above.  No orders of the defined types were placed in this encounter.  No orders of the defined types were placed in this encounter.    Chief Complaint  Patient presents with  . Follow-up    3 Weeks     History of Present Illness:    Tiffany Mitchell is a 86 y.o. female.  Patient was found to have atrial fibrillation with elevated ventricular rate.  Patient is on anticoagulation and tolerating it well.  Patient also is on beta-blocker and blood pressure is borderline.  She denies any problems at this time and takes care of activities of daily living.  She tells me that she has quit  smoking from the last time she saw me.  At the time of my evaluation, the patient is alert awake oriented and in no distress.  Past Medical History:  Diagnosis Date  . Bilateral leg edema   . Bilateral leg edema   . Calculus of gallbladder with chronic cholecystitis without obstruction 10/12/2016  . Chronic kidney disease 07/09/2015  . Cigarette smoker 07/09/2015  . CKD (chronic kidney disease)   . COPD (chronic obstructive pulmonary disease) ([place])   . Cystocele 02/12/2016  . Diastolic dysfunction   . Eczema   . Essential hypertension 07/09/2015  . Gout   . Hypercalcemia   . Hyperlipidemia   . Lumbar pain with radiation down right leg   . Lumbar pain with radiation down right leg   . Moderate mitral insufficiency 07/09/2015  . Osteoarthritis of left knee   . Osteopenia   . Pain in both upper extremities   . Peptic ulcer disease   . Personal history of transient ischemic attack (TIA), and cerebral infarction without residual deficits 07/09/2015  . Postoperative examination 02/12/2016  . Pure hypercholesterolemia 07/09/2015  . Stress incontinence in female 02/12/2016  . Vitamin D deficiency     Past Surgical History:  Procedure Laterality Date  . ABDOMINAL HYSTERECTOMY    . APPENDECTOMY    . KIDNEY SURGERY    . OVARIAN CYST SURGERY    . TUBAL LIGATION      Current Medications: Current Meds  Medication Sig  . acetaminophen (  TYLENOL) 650 MG CR tablet Take 650 mg by mouth every 8 (eight) hours as needed for pain.  Marland Kitchen allopurinol (ZYLOPRIM) 100 MG tablet Take 100 mg by mouth daily.  Marland Kitchen apixaban (ELIQUIS) 5 MG TABS tablet Take 1 tablet (5 mg total) by mouth 2 (two) times daily.  . carvedilol (COREG) 12.5 MG tablet 12.5 mg 2 (two) times daily.  . Cholecalciferol (VITAMIN D3) 50 MCG (2000 UT) capsule Take by mouth.  . clobetasol cream (TEMOVATE) 0.05 % Apply topically.  . conjugated estrogens (PREMARIN) vaginal cream Place vaginally.  . doxazosin (CARDURA) 8 MG tablet Take 8 mg by  mouth at bedtime.  . enalapril (VASOTEC) 20 MG tablet Take 20 mg by mouth 2 (two) times daily.  . Oxyquinoline-Sod Lauryl Sulf (TRIMO-SAN) 0.025-0.01 % GEL Apply vaginally as needed  . pantoprazole (PROTONIX) 40 MG tablet Take 40 mg by mouth daily.  . pravastatin (PRAVACHOL) 40 MG tablet Take 40 mg by mouth at bedtime.  . Prenatal Vit-Fe Fumarate-FA (PNV PRENATAL PLUS MULTIVITAMIN) 27-1 MG TABS Take by mouth.  . tiotropium (SPIRIVA) 18 MCG inhalation capsule Place into inhaler and inhale.  . torsemide (DEMADEX) 20 MG tablet Take 20 mg by mouth every morning.  . triamcinolone cream (KENALOG) 0.1 %   . vitamin B-12 (CYANOCOBALAMIN) 100 MCG tablet Take 100 mcg by mouth daily.     Allergies:   Amlodipine and Hydrocodone-acetaminophen   Social History   Socioeconomic History  . Marital status: Single    Spouse name: Not on file  . Number of children: Not on file  . Years of education: Not on file  . Highest education level: Not on file  Occupational History  . Not on file  Tobacco Use  . Smoking status: Former Smoker    Packs/day: 0.25    Types: Cigarettes    Quit date: 01/19/2020    Years since quitting: 0.0  . Smokeless tobacco: Never Used  Substance and Sexual Activity  . Alcohol use: Never  . Drug use: Never  . Sexual activity: Not on file  Other Topics Concern  . Not on file  Social History Narrative  . Not on file   Social Determinants of Health   Financial Resource Strain:   . Difficulty of Paying Living Expenses: Not on file  Food Insecurity:   . Worried About Charity fundraiser in the Last Year: Not on file  . Ran Out of Food in the Last Year: Not on file  Transportation Needs:   . Lack of Transportation (Medical): Not on file  . Lack of Transportation (Non-Medical): Not on file  Physical Activity:   . Days of Exercise per Week: Not on file  . Minutes of Exercise per Session: Not on file  Stress:   . Feeling of Stress : Not on file  Social Connections:   .  Frequency of Communication with Friends and Family: Not on file  . Frequency of Social Gatherings with Friends and Family: Not on file  . Attends Religious Services: Not on file  . Active Member of Clubs or Organizations: Not on file  . Attends Archivist Meetings: Not on file  . Marital Status: Not on file     Family History: The patient's family history includes Diabetes in her sister.  ROS:   Please see the history of present illness.    All other systems reviewed and are negative.  EKGs/Labs/Other Studies Reviewed:    The following studies were reviewed today: EKG reveals  atrial fibrillation with a ventricular rate of 106 and nonspecific ST-T changes.   Recent Labs: 11/01/2019: ALT 8; BUN 14; Creatinine, Ser 0.89; Hemoglobin 11.2; NT-Pro BNP 1,014; Platelets 222; Potassium 4.0; Sodium 147; TSH 2.560  Recent Lipid Panel No results found for: CHOL, TRIG, HDL, CHOLHDL, VLDL, LDLCALC, LDLDIRECT  Physical Exam:    VS:  BP 112/66   Pulse (!) 42   Ht 5\' 2"  (1.575 m)   Wt 179 lb (81.2 kg)   SpO2 90%   BMI 32.74 kg/m     Wt Readings from Last 3 Encounters:  02/19/20 179 lb (81.2 kg)  01/22/20 172 lb 12.8 oz (78.4 kg)  01/22/20 171 lb (77.6 kg)     GEN: Patient is in no acute distress HEENT: Normal NECK: No JVD; No carotid bruits LYMPHATICS: No lymphadenopathy CARDIAC: Hear sounds regular, 2/6 systolic murmur at the apex. RESPIRATORY:  Clear to auscultation without rales, wheezing or rhonchi  ABDOMEN: Soft, non-tender, non-distended MUSCULOSKELETAL:  No edema; No deformity  SKIN: Warm and dry NEUROLOGIC:  Alert and oriented x 3 PSYCHIATRIC:  Normal affect   Signed, Jenean Lindau, MD  02/19/2020 4:12 PM    [facility]

## 2020-02-19 NOTE — Patient Instructions (Signed)
Medication Instructions:  Your physician has recommended you make the following change in your medication:   Start Digoxin 0.125 mg daily.  *If you need a refill on your cardiac medications before your next appointment, please call your pharmacy*  Lab Work: None ordered If you have labs (blood work) drawn today and your tests are completely normal, you will receive your results only by: Marland Kitchen MyChart Message (if you have MyChart) OR . A paper copy in the mail If you have any lab test that is abnormal or we need to change your treatment, we will call you to review the results.  Testing/Procedures: None ordered  Follow-Up: At [facility], you and your health needs are our priority.  As part of our continuing mission to provide you with exceptional heart care, we have created designated Provider Care Teams.  These Care Teams include your primary Cardiologist (physician) and Advanced Practice Providers (APPs -  Physician Assistants and Nurse Practitioners) who all work together to provide you with the care you need, when you need it.  Your next appointment:   2 week(s)  The format for your next appointment:   In Person  Provider:   Jyl Heinz, MD  Other Instructions NA

## 2020-02-21 LAB — FECAL OCCULT BLOOD, IMMUNOCHEMICAL: Fecal Occult Bld: NEGATIVE

## 2020-03-04 ENCOUNTER — Encounter: Payer: Self-pay | Admitting: Cardiology

## 2020-03-04 ENCOUNTER — Ambulatory Visit (INDEPENDENT_AMBULATORY_CARE_PROVIDER_SITE_OTHER): Payer: Medicare Other | Admitting: Cardiology

## 2020-03-04 ENCOUNTER — Other Ambulatory Visit: Payer: Self-pay

## 2020-03-04 VITALS — BP 130/60 | HR 76 | Ht 62.0 in | Wt 171.0 lb

## 2020-03-04 DIAGNOSIS — I10 Essential (primary) hypertension: Secondary | ICD-10-CM | POA: Diagnosis not present

## 2020-03-04 DIAGNOSIS — Z8673 Personal history of transient ischemic attack (TIA), and cerebral infarction without residual deficits: Secondary | ICD-10-CM

## 2020-03-04 DIAGNOSIS — I340 Nonrheumatic mitral (valve) insufficiency: Secondary | ICD-10-CM

## 2020-03-04 DIAGNOSIS — R0609 Other forms of dyspnea: Secondary | ICD-10-CM

## 2020-03-04 DIAGNOSIS — I480 Paroxysmal atrial fibrillation: Secondary | ICD-10-CM | POA: Diagnosis not present

## 2020-03-04 DIAGNOSIS — R0600 Dyspnea, unspecified: Secondary | ICD-10-CM

## 2020-03-04 NOTE — Progress Notes (Signed)
Cardiology Office Note:    Date:  03/04/2020   ID:  [name], DOB Jul 15, 1930, MRN [id number]  PCP:  Cyndi Bender, PA-C  Cardiologist:  Jenean Lindau, MD   Referring MD: Cyndi Bender, PA-C    ASSESSMENT:    1. Essential hypertension   2. Moderate mitral insufficiency   3. PAF (paroxysmal atrial fibrillation) (H[facility])    PLAN:    In order of problems listed above:  1. Paroxysmal atrial fibrillation:I discussed with the patient atrial fibrillation, disease process. Management and therapy including rate and rhythm control, anticoagulation benefits and potential risks were discussed extensively with the patient. Patient had multiple questions which were answered to patient's satisfaction.  In view of the fact that she has significantly dilated atria I am not sure whether I want to attempt to cardiovert her.  She is doing fine with rate control that is being optimized.  I will keep her on the current medications.  I discussed this with her at length and she vocalized understanding. 2. Essential hypertension: Blood pressure is stable 3. Dyspnea on exertion: She will come back for the next part of the stress test in the next few days.  She will take her medications like carvedilol and digoxin on the morning of the test. 4. In view of the fact that she is on multiple medications she will have a CBC and Chem-7 today. 5. She will be seen in follow-up appointment in a month or she has any concerns.   Medication Adjustments/Labs and Tests Ordered: Current medicines are reviewed at length with the patient today.  Concerns regarding medicines are outlined above.  No orders of the defined types were placed in this encounter.  No orders of the defined types were placed in this encounter.    No chief complaint on file.    History of Present Illness:    Tiffany Mitchell is a 86 y.o. female.  Patient has past medical history of essential hypertension, renal insufficiency.  She was  found to be in atrial fibrillation when she came in for a stress test.  Subsequently she is done fine.  We have initiated on anticoagulation and beta-blocker.  She is also on digoxin.  She denies any chest pain orthopnea or PND.  She takes care of activities of daily living.  No chest pain orthopnea or PND.  Past Medical History:  Diagnosis Date  . Bilateral leg edema   . Bilateral leg edema   . Calculus of gallbladder with chronic cholecystitis without obstruction 10/12/2016  . Chronic kidney disease 07/09/2015  . Cigarette smoker 07/09/2015  . CKD (chronic kidney disease)   . COPD (chronic obstructive pulmonary disease) ([place])   . Cystocele 02/12/2016  . Diastolic dysfunction   . Eczema   . Essential hypertension 07/09/2015  . Gout   . Hypercalcemia   . Hyperlipidemia   . Lumbar pain with radiation down right leg   . Lumbar pain with radiation down right leg   . Moderate mitral insufficiency 07/09/2015  . Osteoarthritis of left knee   . Osteopenia   . Pain in both upper extremities   . Peptic ulcer disease   . Personal history of transient ischemic attack (TIA), and cerebral infarction without residual deficits 07/09/2015  . Postoperative examination 02/12/2016  . Pure hypercholesterolemia 07/09/2015  . Stress incontinence in female 02/12/2016  . Vitamin D deficiency     Past Surgical History:  Procedure Laterality Date  . ABDOMINAL HYSTERECTOMY    . APPENDECTOMY    .  KIDNEY SURGERY    . OVARIAN CYST SURGERY    . TUBAL LIGATION      Current Medications: Current Meds  Medication Sig  . acetaminophen (TYLENOL) 650 MG CR tablet Take 650 mg by mouth every 8 (eight) hours as needed for pain.  Marland Kitchen allopurinol (ZYLOPRIM) 100 MG tablet Take 100 mg by mouth daily.  Marland Kitchen apixaban (ELIQUIS) 5 MG TABS tablet Take 1 tablet (5 mg total) by mouth 2 (two) times daily.  . carvedilol (COREG) 12.5 MG tablet 12.5 mg 2 (two) times daily.  . Cholecalciferol (VITAMIN D3) 50 MCG (2000 UT) capsule Take by  mouth.  . clobetasol cream (TEMOVATE) 0.05 % Apply topically.  . conjugated estrogens (PREMARIN) vaginal cream Place vaginally.  . digoxin (LANOXIN) 0.125 MG tablet Take 1 tablet (0.125 mg total) by mouth daily.  Marland Kitchen doxazosin (CARDURA) 8 MG tablet Take 8 mg by mouth at bedtime.  . enalapril (VASOTEC) 20 MG tablet Take 20 mg by mouth 2 (two) times daily.  . Oxyquinoline-Sod Lauryl Sulf (TRIMO-SAN) 0.025-0.01 % GEL Apply vaginally as needed  . pantoprazole (PROTONIX) 40 MG tablet Take 40 mg by mouth daily.  . pravastatin (PRAVACHOL) 40 MG tablet Take 40 mg by mouth at bedtime.  . Prenatal Vit-Fe Fumarate-FA (PNV PRENATAL PLUS MULTIVITAMIN) 27-1 MG TABS Take by mouth.  . tiotropium (SPIRIVA) 18 MCG inhalation capsule Place into inhaler and inhale.  . torsemide (DEMADEX) 20 MG tablet Take 20 mg by mouth every morning.  . triamcinolone cream (KENALOG) 0.1 %   . vitamin B-12 (CYANOCOBALAMIN) 100 MCG tablet Take 100 mcg by mouth daily.     Allergies:   Amlodipine and Hydrocodone-acetaminophen   Social History   Socioeconomic History  . Marital status: Single    Spouse name: Not on file  . Number of children: Not on file  . Years of education: Not on file  . Highest education level: Not on file  Occupational History  . Not on file  Tobacco Use  . Smoking status: Former Smoker    Packs/day: 0.25    Types: Cigarettes    Quit date: 01/19/2020    Years since quitting: 0.1  . Smokeless tobacco: Never Used  Substance and Sexual Activity  . Alcohol use: Never  . Drug use: Never  . Sexual activity: Not on file  Other Topics Concern  . Not on file  Social History Narrative  . Not on file   Social Determinants of Health   Financial Resource Strain:   . Difficulty of Paying Living Expenses: Not on file  Food Insecurity:   . Worried About Charity fundraiser in the Last Year: Not on file  . Ran Out of Food in the Last Year: Not on file  Transportation Needs:   . Lack of Transportation  (Medical): Not on file  . Lack of Transportation (Non-Medical): Not on file  Physical Activity:   . Days of Exercise per Week: Not on file  . Minutes of Exercise per Session: Not on file  Stress:   . Feeling of Stress : Not on file  Social Connections:   . Frequency of Communication with Friends and Family: Not on file  . Frequency of Social Gatherings with Friends and Family: Not on file  . Attends Religious Services: Not on file  . Active Member of Clubs or Organizations: Not on file  . Attends Archivist Meetings: Not on file  . Marital Status: Not on file     Family  History: The patient's family history includes Diabetes in her sister.  ROS:   Please see the history of present illness.    All other systems reviewed and are negative.  EKGs/Labs/Other Studies Reviewed:    The following studies were reviewed today: IMPRESSIONS    1. Left ventricular ejection fraction, by visual estimation, is 55 to  60%. The left ventricle has normal function. There is moderately increased  left ventricular hypertrophy.  2. The left ventricle has no regional wall motion abnormalities.  3. Left ventricular diastolic parameters are consistent with Grade II  diastolic dysfunction (pseudonormalization).  4. Global right ventricle has normal systolic function.The right  ventricular size is normal. No increase in right ventricular wall  thickness.  5. Left atrial size was severely dilated.  6. Right atrial size was moderately dilated.  7. The mitral valve is abnormal with severe posterior mitral annular  calcification. Moderate mitral valve regurgitation. No evidence of mitral  stenosis.  8. The tricuspid valve is normal in structure. Trivial Tricuspid  Regurgitation.  9. The aortic valve leaflets are thickened and mildly calcified.  Mild-Moderate aortic stenosis. Aortic valve regurgitation is not  visualized.  10. The pulmonic valve was normal in structure. Pulmonic  valve  regurgitation is trivial.  11. Mild Pulmonary Hypertension. Mildly elevated pulmonary artery systolic  pressure, 38 mmHG.  12. No prior study for comparision.    Recent Labs: 11/01/2019: ALT 8; BUN 14; Creatinine, Ser 0.89; Hemoglobin 11.2; NT-Pro BNP 1,014; Platelets 222; Potassium 4.0; Sodium 147; TSH 2.560  Recent Lipid Panel No results found for: CHOL, TRIG, HDL, CHOLHDL, VLDL, LDLCALC, LDLDIRECT  Physical Exam:    VS:  BP 130/60   Pulse 76   Ht 5\' 2"  (1.575 m)   Wt 171 lb (77.6 kg)   SpO2 94%   BMI 31.28 kg/m     Wt Readings from Last 3 Encounters:  03/04/20 171 lb (77.6 kg)  02/19/20 179 lb (81.2 kg)  01/22/20 172 lb 12.8 oz (78.4 kg)     GEN: Patient is in no acute distress HEENT: Normal NECK: No JVD; No carotid bruits LYMPHATICS: No lymphadenopathy CARDIAC: Hear sounds regular, 2/6 systolic murmur at the apex. RESPIRATORY:  Clear to auscultation without rales, wheezing or rhonchi  ABDOMEN: Soft, non-tender, non-distended MUSCULOSKELETAL:  No edema; No deformity  SKIN: Warm and dry NEUROLOGIC:  Alert and oriented x 3 PSYCHIATRIC:  Normal affect   Signed, Jenean Lindau, MD  03/04/2020 3:52 PM    [facility]

## 2020-03-04 NOTE — Patient Instructions (Addendum)
Medication Instructions:  No medication chanes *If you need a refill on your cardiac medications before your next appointment, please call your pharmacy*   Lab Work: You had labs done today. If you have labs (blood work) drawn today and your tests are completely normal, you will receive your results only by: Marland Kitchen MyChart Message (if you have MyChart) OR . A paper copy in the mail If you have any lab test that is abnormal or we need to change your treatment, we will call you to review the results.   Testing/Procedures: Your physician has requested that you have a lexiscan myoview. For further information please visit [email or web address]. Please follow instruction sheet, as given.  The test will take approximately 3 to 4 hours to complete; you may bring reading material.  If someone comes with you to your appointment, they will need to remain in the main lobby due to limited space in the testing area. **If you are pregnant or breastfeeding, please notify the nuclear lab prior to your appointment**  How to prepare for your Myocardial Perfusion Test: . Do not eat or drink 3 hours prior to your test, except you may have water. . Do not consume products containing caffeine (regular or decaffeinated) 12 hours prior to your test. (ex: coffee, chocolate, sodas, tea). . Take your carvedilol (COREG) 12.5 MG tablet and Digoxin on the morning of the test.Do bring a list of your current medications with you. . Do wear comfortable clothes (no dresses or overalls) and walking shoes, tennis shoes preferred (No heels or open toe shoes are allowed). . Do NOT wear cologne, perfume, aftershave, or lotions (deodorant is allowed). . If these instructions are not followed, your test will have to be rescheduled.     Follow-Up: At [facility], you and your health needs are our priority.  As part of our continuing mission to provide you with exceptional heart care, we have created designated Provider Care Teams.   These Care Teams include your primary Cardiologist (physician) and Advanced Practice Providers (APPs -  Physician Assistants and Nurse Practitioners) who all work together to provide you with the care you need, when you need it.  We recommend signing up for the patient portal called "MyChart".  Sign up information is provided on this After Visit Summary.  MyChart is used to connect with patients for Virtual Visits (Telemedicine).  Patients are able to view lab/test results, encounter notes, upcoming appointments, etc.  Non-urgent messages can be sent to your provider as well.   To learn more about what you can do with MyChart, go to [email or web address].    Your next appointment:   2 month(s)  The format for your next appointment:   In Person  Provider:   Jyl Heinz, MD   Other Instructions NA

## 2020-03-05 LAB — BASIC METABOLIC PANEL
BUN/Creatinine Ratio: 18 (ref 12–28)
BUN: 20 mg/dL (ref 8–27)
CO2: 30 mmol/L — ABNORMAL HIGH (ref 20–29)
Calcium: 9.1 mg/dL (ref 8.7–10.3)
Chloride: 98 mmol/L (ref 96–106)
Creatinine, Ser: 1.09 mg/dL — ABNORMAL HIGH (ref 0.57–1.00)
GFR calc Af Amer: 53 mL/min/{1.73_m2} — ABNORMAL LOW (ref >59)
GFR calc non Af Amer: 46 mL/min/{1.73_m2} — ABNORMAL LOW (ref >59)
Glucose: 120 mg/dL — ABNORMAL HIGH (ref 65–99)
Potassium: 4.1 mmol/L (ref 3.5–5.2)
Sodium: 143 mmol/L (ref 134–144)

## 2020-03-05 LAB — CBC WITH DIFFERENTIAL/PLATELET
Basophils Absolute: 0 10*3/uL (ref 0.0–0.2)
Basos: 0 %
EOS (ABSOLUTE): 0.2 10*3/uL (ref 0.0–0.4)
Eos: 2 %
Hematocrit: 31 % — ABNORMAL LOW (ref 34.0–46.6)
Hemoglobin: 10.3 g/dL — ABNORMAL LOW (ref 11.1–15.9)
Immature Grans (Abs): 0.1 10*3/uL (ref 0.0–0.1)
Immature Granulocytes: 1 %
Lymphocytes Absolute: 2 10*3/uL (ref 0.7–3.1)
Lymphs: 19 %
MCH: 30.6 pg (ref 26.6–33.0)
MCHC: 33.2 g/dL (ref 31.5–35.7)
MCV: 92 fL (ref 79–97)
Monocytes Absolute: 1 10*3/uL — ABNORMAL HIGH (ref 0.1–0.9)
Monocytes: 10 %
Neutrophils Absolute: 7.3 10*3/uL — ABNORMAL HIGH (ref 1.4–7.0)
Neutrophils: 68 %
Platelets: 229 10*3/uL (ref 150–450)
RBC: 3.37 x10E6/uL — ABNORMAL LOW (ref 3.77–5.28)
RDW: 13.2 % (ref 11.7–15.4)
WBC: 10.7 10*3/uL (ref 3.4–10.8)

## 2020-03-06 ENCOUNTER — Other Ambulatory Visit: Payer: Self-pay

## 2020-03-06 DIAGNOSIS — D649 Anemia, unspecified: Secondary | ICD-10-CM

## 2020-03-26 ENCOUNTER — Telehealth: Payer: Self-pay | Admitting: *Deleted

## 2020-03-26 NOTE — Telephone Encounter (Signed)
Patient given detailed instructions per Myocardial Perfusion Study Information Sheet for the test on 04/01/2020 at 1115. Patient notified to arrive 15 minutes early and that it is imperative to arrive on time for appointment to keep from having the test rescheduled.  If you need to cancel or reschedule your appointment, please call the office within 24 hours of your appointment. . Patient verbalized understanding.Christalyn Goertz, [place] Patient does not use her mychart

## 2020-04-01 ENCOUNTER — Other Ambulatory Visit: Payer: Self-pay

## 2020-04-01 ENCOUNTER — Ambulatory Visit (INDEPENDENT_AMBULATORY_CARE_PROVIDER_SITE_OTHER): Payer: Medicare Other

## 2020-04-01 VITALS — Ht 62.0 in | Wt 171.0 lb

## 2020-04-01 DIAGNOSIS — R0609 Other forms of dyspnea: Secondary | ICD-10-CM

## 2020-04-01 DIAGNOSIS — R0600 Dyspnea, unspecified: Secondary | ICD-10-CM | POA: Diagnosis not present

## 2020-04-01 DIAGNOSIS — Z8673 Personal history of transient ischemic attack (TIA), and cerebral infarction without residual deficits: Secondary | ICD-10-CM

## 2020-04-01 LAB — MYOCARDIAL PERFUSION IMAGING
LV dias vol: 84 mL (ref 46–106)
LV sys vol: 20 mL
Peak HR: 73 {beats}/min
Rest HR: 65 {beats}/min
SDS: 4
SRS: 2
SSS: 6
TID: 1.06

## 2020-04-01 MED ORDER — TECHNETIUM TC 99M TETROFOSMIN IV KIT
32.1000 | PACK | Freq: Once | INTRAVENOUS | Status: AC | PRN
  Administered 2020-04-01: 32.1 via INTRAVENOUS

## 2020-04-01 MED ORDER — TECHNETIUM TC 99M TETROFOSMIN IV KIT
10.5000 | PACK | Freq: Once | INTRAVENOUS | Status: AC | PRN
  Administered 2020-04-01: 10.5 via INTRAVENOUS

## 2020-04-01 MED ORDER — REGADENOSON 0.4 MG/5ML IV SOLN
0.4000 mg | Freq: Once | INTRAVENOUS | Status: AC
  Administered 2020-04-01: 0.4 mg via INTRAVENOUS

## 2020-04-06 ENCOUNTER — Telehealth: Payer: Self-pay

## 2020-04-06 ENCOUNTER — Ambulatory Visit: Payer: Medicare Other | Admitting: Cardiology

## 2020-04-06 MED ORDER — RIVAROXABAN 20 MG PO TABS: 20.0000 mg | ORAL_TABLET | Freq: Every day | ORAL | 6 refills | Status: DC

## 2020-04-06 NOTE — Telephone Encounter (Signed)
Pt reports diarrhea after starting Eliquis. Per Dr. Geraldo Pitter pt will change to Xarelto 20 mg daily. Pt understands to take the first dose of Xarelto 12 hours after last dose of Eliquis.

## 2020-05-06 ENCOUNTER — Other Ambulatory Visit: Payer: Self-pay

## 2020-05-06 ENCOUNTER — Encounter: Payer: Self-pay | Admitting: Cardiology

## 2020-05-06 ENCOUNTER — Ambulatory Visit (INDEPENDENT_AMBULATORY_CARE_PROVIDER_SITE_OTHER): Payer: Medicare Other | Admitting: Cardiology

## 2020-05-06 VITALS — BP 126/68 | HR 73 | Temp 95.5°F | Ht 62.0 in | Wt 169.0 lb

## 2020-05-06 DIAGNOSIS — I480 Paroxysmal atrial fibrillation: Secondary | ICD-10-CM | POA: Diagnosis not present

## 2020-05-06 DIAGNOSIS — I340 Nonrheumatic mitral (valve) insufficiency: Secondary | ICD-10-CM

## 2020-05-06 DIAGNOSIS — I10 Essential (primary) hypertension: Secondary | ICD-10-CM

## 2020-05-06 NOTE — Progress Notes (Signed)
Cardiology Office Note:    Date:  05/06/2020   ID:  Tiffany Mitchell, DOB Jun 16, 1930, MRN [id number]  PCP:  Cyndi Bender, PA-C  Cardiologist:  Jenean Lindau, MD   Referring MD: Cyndi Bender, PA-C    ASSESSMENT:    1. Essential hypertension   2. Moderate mitral insufficiency   3. PAF (paroxysmal atrial fibrillation) (HCC)    PLAN:    In order of problems listed above:  1. Paroxysmal fibrillation:I discussed with the patient atrial fibrillation, disease process. Management and therapy including rate and rhythm control, anticoagulation benefits and potential risks were discussed extensively with the patient. Patient had multiple questions which were answered to patient's satisfaction. 2. Essential hypertension: Blood pressure stable 3. Moderate mitral regurgitation: Stable blood pressure.  Medical management at this time. 4. Pedal edema: Patient on diuretic.  Lifestyle modification was discussed salt intake issues and compression stocking and feet elevation topics were re visited with her and she vocalized understanding.  She is already on diuretic therapy and she will have a Chem-7 today. 5. Patient will be seen in follow-up appointment in 6 months or earlier if the patient has any concerns    Medication Adjustments/Labs and Tests Ordered: Current medicines are reviewed at length with the patient today.  Concerns regarding medicines are outlined above.  No orders of the defined types were placed in this encounter.  No orders of the defined types were placed in this encounter.    Chief Complaint  Patient presents with  . Follow-up    2 MO FU      History of Present Illness:    Tiffany Mitchell is a 86 y.o. female.  Patient has past medical history of proximal fibrillation, essential hypertension and dyslipidemia.  She is an ex-smoker and quit a couple of months ago.  She has moderate mitral regurgitation.  She denies any problems at this time and takes care of  activities of daily living.  No chest pain orthopnea or PND.  She has pedal edema which is chronic.  Past Medical History:  Diagnosis Date  . Bilateral leg edema   . Bilateral leg edema   . Calculus of gallbladder with chronic cholecystitis without obstruction 10/12/2016  . Chronic kidney disease 07/09/2015  . Cigarette smoker 07/09/2015  . CKD (chronic kidney disease)   . COPD (chronic obstructive pulmonary disease) ([place])   . Cystocele 02/12/2016  . Diastolic dysfunction   . DOE (dyspnea on exertion) 11/01/2019  . Eczema   . Essential hypertension 07/09/2015  . Gout   . Hypercalcemia   . Hyperlipidemia   . Lumbar pain with radiation down right leg   . Lumbar pain with radiation down right leg   . Moderate mitral insufficiency 07/09/2015  . Osteoarthritis of left knee   . Osteopenia   . PAF (paroxysmal atrial fibrillation) ([place]) 01/22/2020  . Pain in both upper extremities   . Pedal edema 11/01/2019  . Peptic ulcer disease   . Personal history of transient ischemic attack (TIA), and cerebral infarction without residual deficits 07/09/2015  . Postoperative examination 02/12/2016  . Pure hypercholesterolemia 07/09/2015  . Stress incontinence in female 02/12/2016  . Vitamin D deficiency     Past Surgical History:  Procedure Laterality Date  . ABDOMINAL HYSTERECTOMY    . APPENDECTOMY    . KIDNEY SURGERY    . OVARIAN CYST SURGERY    . TUBAL LIGATION      Current Medications: Current Meds  Medication Sig  . acetaminophen (  TYLENOL) 650 MG CR tablet Take 650 mg by mouth every 8 (eight) hours as needed for pain.  Marland Kitchen allopurinol (ZYLOPRIM) 100 MG tablet Take 100 mg by mouth daily.  . carvedilol (COREG) 12.5 MG tablet 12.5 mg 2 (two) times daily.  . Cholecalciferol (VITAMIN D3) 50 MCG (2000 UT) capsule Take by mouth.  . clobetasol cream (TEMOVATE) 0.05 % Apply topically.  . conjugated estrogens (PREMARIN) vaginal cream Place vaginally.  . digoxin (LANOXIN) 0.125 MG tablet Take 1 tablet  (0.125 mg total) by mouth daily.  Marland Kitchen doxazosin (CARDURA) 8 MG tablet Take 8 mg by mouth at bedtime.  . enalapril (VASOTEC) 20 MG tablet Take 20 mg by mouth 2 (two) times daily.  . Oxyquinoline-Sod Lauryl Sulf (TRIMO-SAN) 0.025-0.01 % GEL Apply vaginally as needed  . pantoprazole (PROTONIX) 40 MG tablet Take 40 mg by mouth daily.  . pravastatin (PRAVACHOL) 40 MG tablet Take 40 mg by mouth at bedtime.  . Prenatal Vit-Fe Fumarate-FA (PNV PRENATAL PLUS MULTIVITAMIN) 27-1 MG TABS Take by mouth.  . rivaroxaban (XARELTO) 20 MG TABS tablet Take 1 tablet (20 mg total) by mouth daily with supper.  . tiotropium (SPIRIVA) 18 MCG inhalation capsule Place into inhaler and inhale.  . torsemide (DEMADEX) 20 MG tablet Take 20 mg by mouth every morning.  . triamcinolone cream (KENALOG) 0.1 %   . vitamin B-12 (CYANOCOBALAMIN) 100 MCG tablet Take 100 mcg by mouth daily.     Allergies:   Amlodipine and Hydrocodone-acetaminophen   Social History   Socioeconomic History  . Marital status: Single    Spouse name: Not on file  . Number of children: Not on file  . Years of education: Not on file  . Highest education level: Not on file  Occupational History  . Not on file  Tobacco Use  . Smoking status: Former Smoker    Packs/day: 0.25    Types: Cigarettes    Quit date: 01/19/2020    Years since quitting: 0.2  . Smokeless tobacco: Never Used  Substance and Sexual Activity  . Alcohol use: Never  . Drug use: Never  . Sexual activity: Not on file  Other Topics Concern  . Not on file  Social History Narrative  . Not on file   Social Determinants of Health   Financial Resource Strain:   . Difficulty of Paying Living Expenses:   Food Insecurity:   . Worried About Charity fundraiser in the Last Year:   . Arboriculturist in the Last Year:   Transportation Needs:   . Film/video editor (Medical):   Marland Kitchen Lack of Transportation (Non-Medical):   Physical Activity:   . Days of Exercise per Week:   .  Minutes of Exercise per Session:   Stress:   . Feeling of Stress :   Social Connections:   . Frequency of Communication with Friends and Family:   . Frequency of Social Gatherings with Friends and Family:   . Attends Religious Services:   . Active Member of Clubs or Organizations:   . Attends Archivist Meetings:   Marland Kitchen Marital Status:      Family History: The patient's family history includes Diabetes in her sister.  ROS:   Please see the history of present illness.    All other systems reviewed and are negative.  EKGs/Labs/Other Studies Reviewed:    The following studies were reviewed today: I discussed my findings with the patient at length.   Recent Labs: 11/01/2019: ALT  8; NT-Pro BNP 1,014; TSH 2.560 03/04/2020: BUN 20; Creatinine, Ser 1.09; Hemoglobin 10.3; Platelets 229; Potassium 4.1; Sodium 143  Recent Lipid Panel No results found for: CHOL, TRIG, HDL, CHOLHDL, VLDL, LDLCALC, LDLDIRECT  Physical Exam:    VS:  BP 126/68   Pulse 73   Temp (!) 95.5 F (35.3 C)   Ht 5\' 2"  (1.575 m)   Wt 169 lb (76.7 kg)   SpO2 93%   BMI 30.91 kg/m     Wt Readings from Last 3 Encounters:  05/06/20 169 lb (76.7 kg)  04/01/20 171 lb (77.6 kg)  03/04/20 171 lb (77.6 kg)     GEN: Patient is in no acute distress HEENT: Normal NECK: No JVD; No carotid bruits LYMPHATICS: No lymphadenopathy CARDIAC: Hear sounds regular, 2/6 systolic murmur at the apex. RESPIRATORY:  Clear to auscultation without rales, wheezing or rhonchi  ABDOMEN: Soft, non-tender, non-distended MUSCULOSKELETAL:  No edema; No deformity  SKIN: Warm and dry NEUROLOGIC:  Alert and oriented x 3 PSYCHIATRIC:  Normal affect   Signed, Jenean Lindau, MD  05/06/2020 2:29 PM    [place]

## 2020-05-06 NOTE — Patient Instructions (Signed)
Medication Instructions:  No medication changes. *If you need a refill on your cardiac medications before your next appointment, please call your pharmacy*   Lab Work: Your physician recommends that you have a BMET done today.  If you have labs (blood work) drawn today and your tests are completely normal, you will receive your results only by: Marland Kitchen MyChart Message (if you have MyChart) OR . A paper copy in the mail If you have any lab test that is abnormal or we need to change your treatment, we will call you to review the results.   Testing/Procedures: None ordered   Follow-Up: At [facility], you and your health needs are our priority.  As part of our continuing mission to provide you with exceptional heart care, we have created designated Provider Care Teams.  These Care Teams include your primary Cardiologist (physician) and Advanced Practice Providers (APPs -  Physician Assistants and Nurse Practitioners) who all work together to provide you with the care you need, when you need it.  We recommend signing up for the patient portal called "MyChart".  Sign up information is provided on this After Visit Summary.  MyChart is used to connect with patients for Virtual Visits (Telemedicine).  Patients are able to view lab/test results, encounter notes, upcoming appointments, etc.  Non-urgent messages can be sent to your provider as well.   To learn more about what you can do with MyChart, go to [email or web address].    Your next appointment:   6 month(s)  The format for your next appointment:   In Person  Provider:   Jyl Heinz, MD   Other Instructions NA

## 2020-05-07 LAB — BASIC METABOLIC PANEL
BUN/Creatinine Ratio: 15 (ref 12–28)
BUN: 16 mg/dL (ref 8–27)
CO2: 30 mmol/L — ABNORMAL HIGH (ref 20–29)
Calcium: 9.9 mg/dL (ref 8.7–10.3)
Chloride: 100 mmol/L (ref 96–106)
Creatinine, Ser: 1.05 mg/dL — ABNORMAL HIGH (ref 0.57–1.00)
GFR calc Af Amer: 55 mL/min/{1.73_m2} — ABNORMAL LOW (ref >59)
GFR calc non Af Amer: 48 mL/min/{1.73_m2} — ABNORMAL LOW (ref >59)
Glucose: 90 mg/dL (ref 65–99)
Potassium: 4.2 mmol/L (ref 3.5–5.2)
Sodium: 146 mmol/L — ABNORMAL HIGH (ref 134–144)

## 2020-05-18 ENCOUNTER — Encounter: Payer: Self-pay | Admitting: Cardiology

## 2020-07-01 ENCOUNTER — Ambulatory Visit: Payer: Medicare Other | Admitting: Cardiology

## 2020-07-06 ENCOUNTER — Ambulatory Visit: Payer: Medicare Other | Admitting: Cardiology

## 2020-08-18 ENCOUNTER — Encounter: Payer: Self-pay | Admitting: Cardiology

## 2020-08-18 ENCOUNTER — Ambulatory Visit (INDEPENDENT_AMBULATORY_CARE_PROVIDER_SITE_OTHER): Payer: Medicare Other | Admitting: Cardiology

## 2020-08-18 ENCOUNTER — Other Ambulatory Visit: Payer: Self-pay

## 2020-08-18 VITALS — BP 140/60 | HR 78 | Ht 62.0 in | Wt 164.8 lb

## 2020-08-18 DIAGNOSIS — I10 Essential (primary) hypertension: Secondary | ICD-10-CM | POA: Diagnosis not present

## 2020-08-18 DIAGNOSIS — E7800 Pure hypercholesterolemia, unspecified: Secondary | ICD-10-CM | POA: Diagnosis not present

## 2020-08-18 DIAGNOSIS — I340 Nonrheumatic mitral (valve) insufficiency: Secondary | ICD-10-CM | POA: Diagnosis not present

## 2020-08-18 DIAGNOSIS — I480 Paroxysmal atrial fibrillation: Secondary | ICD-10-CM | POA: Diagnosis not present

## 2020-08-18 NOTE — Patient Instructions (Signed)

## 2020-08-18 NOTE — Progress Notes (Signed)
Cardiology Office Note:    Date:  08/18/2020   ID:  Tiffany Mitchell, DOB [birth year], MRN [id number]  PCP:  Cyndi Bender, PA-C  Cardiologist:  Jenean Lindau, MD   Referring MD: Cyndi Bender, PA-C    ASSESSMENT:    1. Essential hypertension   2. Moderate mitral insufficiency   3. PAF (paroxysmal atrial fibrillation) ([place])   4. Pure hypercholesterolemia    PLAN:    In order of problems listed above:  1. Primary prevention stressed with the patient.  Importance of compliance with diet medication stressed and she vocalized understanding.  Importance of regular age-appropriate exercise stressed and she promises to do so. 2. Essential hypertension: Blood pressure stable diet was emphasized. 3. Moderate mitral insufficiency: Stable at this time.  Asymptomatic. 4. Paroxysmal atrial fibrillation:I discussed with the patient atrial fibrillation, disease process. Management and therapy including rate and rhythm control, anticoagulation benefits and potential risks were discussed extensively with the patient. Patient had multiple questions which were answered to patient's satisfaction. 5. Mixed dyslipidemia: Diet was emphasized.  Patient is on statin therapy recent lab work was reviewed and the patient will be seen in follow-up appointment in 6 months or earlier if she has any concerns.   Medication Adjustments/Labs and Tests Ordered: Current medicines are reviewed at length with the patient today.  Concerns regarding medicines are outlined above.  No orders of the defined types were placed in this encounter.  No orders of the defined types were placed in this encounter.    No chief complaint on file.    History of Present Illness:    Tiffany Mitchell is a 86 y.o. female.  Patient has past medical history of essential hypertension dyslipidemia paroxysmal atrial fibrillation.  She denies any problems at this time and takes care of activities of daily living.  No chest pain  orthopnea or PND.  At the time of my evaluation, the patient is alert awake oriented and in no distress.  Past Medical History:  Diagnosis Date  . Bilateral leg edema   . Bilateral leg edema   . Calculus of gallbladder with chronic cholecystitis without obstruction 10/12/2016  . Chronic kidney disease 07/09/2015  . Cigarette smoker 07/09/2015  . CKD (chronic kidney disease)   . COPD (chronic obstructive pulmonary disease) ([place])   . Cystocele 02/12/2016  . Diastolic dysfunction   . DOE (dyspnea on exertion) 11/01/2019  . Eczema   . Essential hypertension 07/09/2015  . Gout   . Hypercalcemia   . Hyperlipidemia   . Lumbar pain with radiation down right leg   . Lumbar pain with radiation down right leg   . Moderate mitral insufficiency 07/09/2015  . Osteoarthritis of left knee   . Osteopenia   . PAF (paroxysmal atrial fibrillation) ([place]) 01/22/2020  . Pain in both upper extremities   . Pedal edema 11/01/2019  . Peptic ulcer disease   . Personal history of transient ischemic attack (TIA), and cerebral infarction without residual deficits 07/09/2015  . Postoperative examination 02/12/2016  . Pure hypercholesterolemia 07/09/2015  . Stress incontinence in female 02/12/2016  . Vitamin D deficiency     Past Surgical History:  Procedure Laterality Date  . ABDOMINAL HYSTERECTOMY    . APPENDECTOMY    . KIDNEY SURGERY    . OVARIAN CYST SURGERY    . TUBAL LIGATION      Current Medications: Current Meds  Medication Sig  . acetaminophen (TYLENOL) 650 MG CR tablet Take 650 mg by mouth every  8 (eight) hours as needed for pain.  Marland Kitchen allopurinol (ZYLOPRIM) 100 MG tablet Take 100 mg by mouth daily.  . carvedilol (COREG) 12.5 MG tablet 12.5 mg 2 (two) times daily.  . Cholecalciferol (VITAMIN D3) 50 MCG (2000 UT) capsule Take by mouth.  . clobetasol cream (TEMOVATE) 0.05 % Apply topically.  . conjugated estrogens (PREMARIN) vaginal cream Place vaginally.  . digoxin (LANOXIN) 0.125 MG tablet Take 1  tablet (0.125 mg total) by mouth daily.  Marland Kitchen doxazosin (CARDURA) 8 MG tablet Take 8 mg by mouth at bedtime.  . enalapril (VASOTEC) 20 MG tablet Take 20 mg by mouth 2 (two) times daily.  . hydrocortisone 2.5 % lotion Apply 1 application topically daily as needed.  . Oxyquinoline-Sod Lauryl Sulf (TRIMO-SAN) 0.025-0.01 % GEL Apply vaginally as needed  . pantoprazole (PROTONIX) 40 MG tablet Take 40 mg by mouth daily.  . pravastatin (PRAVACHOL) 40 MG tablet Take 40 mg by mouth at bedtime.  . Prenatal Vit-Fe Fumarate-FA (PNV PRENATAL PLUS MULTIVITAMIN) 27-1 MG TABS Take by mouth.  . rivaroxaban (XARELTO) 20 MG TABS tablet Take 1 tablet (20 mg total) by mouth daily with supper.  . tiotropium (SPIRIVA) 18 MCG inhalation capsule Place into inhaler and inhale.  . torsemide (DEMADEX) 20 MG tablet Take 20 mg by mouth every morning.  . triamcinolone cream (KENALOG) 0.1 %   . vitamin B-12 (CYANOCOBALAMIN) 100 MCG tablet Take 100 mcg by mouth daily.     Allergies:   Amlodipine and Hydrocodone-acetaminophen   Social History   Socioeconomic History  . Marital status: Single    Spouse name: Not on file  . Number of children: Not on file  . Years of education: Not on file  . Highest education level: Not on file  Occupational History  . Not on file  Tobacco Use  . Smoking status: Former Smoker    Packs/day: 0.25    Types: Cigarettes    Quit date: 01/19/2020    Years since quitting: 0.5  . Smokeless tobacco: Never Used  Substance and Sexual Activity  . Alcohol use: Never  . Drug use: Never  . Sexual activity: Not on file  Other Topics Concern  . Not on file  Social History Narrative  . Not on file   Social Determinants of Health   Financial Resource Strain:   . Difficulty of Paying Living Expenses: Not on file  Food Insecurity:   . Worried About Charity fundraiser in the Last Year: Not on file  . Ran Out of Food in the Last Year: Not on file  Transportation Needs:   . Lack of  Transportation (Medical): Not on file  . Lack of Transportation (Non-Medical): Not on file  Physical Activity:   . Days of Exercise per Week: Not on file  . Minutes of Exercise per Session: Not on file  Stress:   . Feeling of Stress : Not on file  Social Connections:   . Frequency of Communication with Friends and Family: Not on file  . Frequency of Social Gatherings with Friends and Family: Not on file  . Attends Religious Services: Not on file  . Active Member of Clubs or Organizations: Not on file  . Attends Archivist Meetings: Not on file  . Marital Status: Not on file     Family History: The patient's family history includes Diabetes in her sister.  ROS:   Please see the history of present illness.    All other systems reviewed and  are negative.  EKGs/Labs/Other Studies Reviewed:    The following studies were reviewed today: The patient at extensive length   Recent Labs: 11/01/2019: ALT 8; NT-Pro BNP 1,014; TSH 2.560 03/04/2020: Hemoglobin 10.3; Platelets 229 05/06/2020: BUN 16; Creatinine, Ser 1.05; Potassium 4.2; Sodium 146  Recent Lipid Panel No results found for: CHOL, TRIG, HDL, CHOLHDL, VLDL, LDLCALC, LDLDIRECT  Physical Exam:    VS:  BP (!) 191/68   Pulse 78   Ht 5\' 2"  (1.575 m)   Wt 164 lb 12.8 oz (74.8 kg)   SpO2 95%   BMI 30.14 kg/m     Wt Readings from Last 3 Encounters:  08/18/20 164 lb 12.8 oz (74.8 kg)  05/06/20 169 lb (76.7 kg)  04/01/20 171 lb (77.6 kg)     GEN: Patient is in no acute distress HEENT: Normal NECK: No JVD; No carotid bruits LYMPHATICS: No lymphadenopathy CARDIAC: Hear sounds regular, 2/6 systolic murmur at the apex. RESPIRATORY:  Clear to auscultation without rales, wheezing or rhonchi  ABDOMEN: Soft, non-tender, non-distended MUSCULOSKELETAL:  No edema; No deformity  SKIN: Warm and dry NEUROLOGIC:  Alert and oriented x 3 PSYCHIATRIC:  Normal affect   Signed, Jenean Lindau, MD  08/18/2020 9:52 AM    [place]

## 2020-09-15 ENCOUNTER — Other Ambulatory Visit: Payer: Self-pay | Admitting: Cardiology

## 2020-11-09 LAB — PROTIME-INR: INR: 11.3 — AB (ref 0.9–1.1)

## 2020-12-07 ENCOUNTER — Other Ambulatory Visit: Payer: Self-pay | Admitting: Cardiology

## 2020-12-08 NOTE — Telephone Encounter (Signed)
Prescription refill request for Xarelto received.  Indication: atrial fibrillation Last office visit: 07/2020 revankar Weight:74.8 kg Age:86 Scr:1.05 CrCl:43.73 ml/min  May need to reduce to 15 mg

## 2020-12-10 ENCOUNTER — Other Ambulatory Visit: Payer: Self-pay | Admitting: Pharmacist Clinician (PhC)/ Clinical Pharmacy Specialist

## 2020-12-10 MED ORDER — RIVAROXABAN 15 MG PO TABS: 15.0000 mg | ORAL_TABLET | Freq: Two times a day (BID) | ORAL | 1 refills | Status: DC

## 2020-12-10 NOTE — Telephone Encounter (Signed)
Agree - dose changed to 15 mg and patient notified

## 2021-01-08 IMAGING — CT CT ANGIO CHEST
2 of 8 series · 18 of 36 positions shown · IV contrast (Omnipaque)
Comparison: CT enterography 10/04/2016

CLINICAL DATA: PE suspected, intermediate probability, positive
D-dimer, history of COPD and current smoking

EXAM:
CT ANGIOGRAPHY CHEST WITH CONTRAST
TECHNIQUE: Multidetector CT imaging of the chest was performed using the
standard protocol during bolus administration of intravenous
contrast. Multiplanar CT image reconstructions and MIPs were
obtained to evaluate the vascular anatomy.
CONTRAST:  70mL OMNIPAQUE IOHEXOL 350 MG/ML SOLN

[Series 6: pe thins · axial · 0.71mm/px · z∈[-212,+44]mm · 17 of 288 slices shown]
[im 16/288  lung]
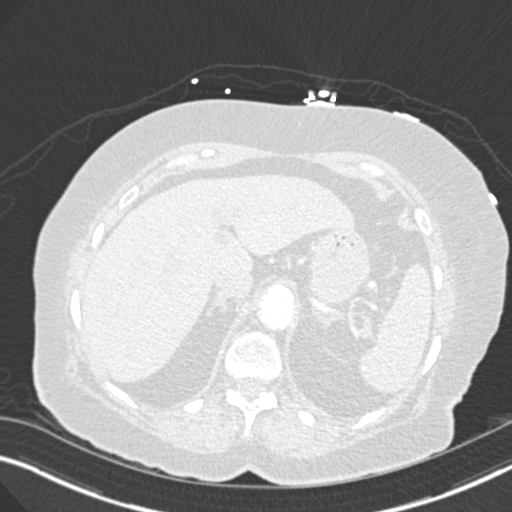
[im 31/288  mediastinal]
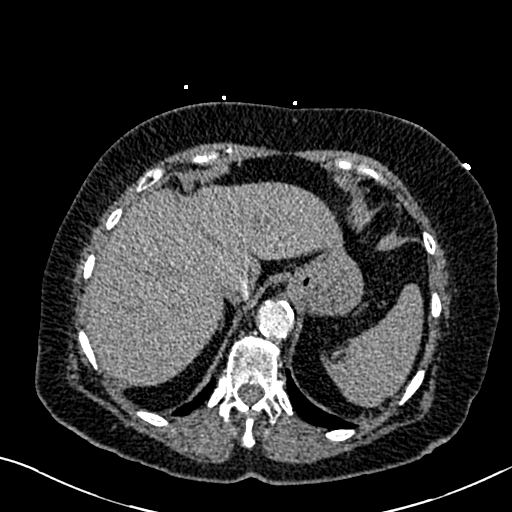
[im 46/288  lung]
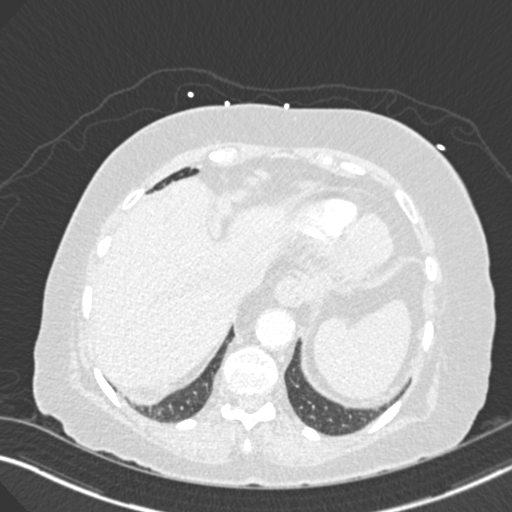
[im 61/288  mediastinal]
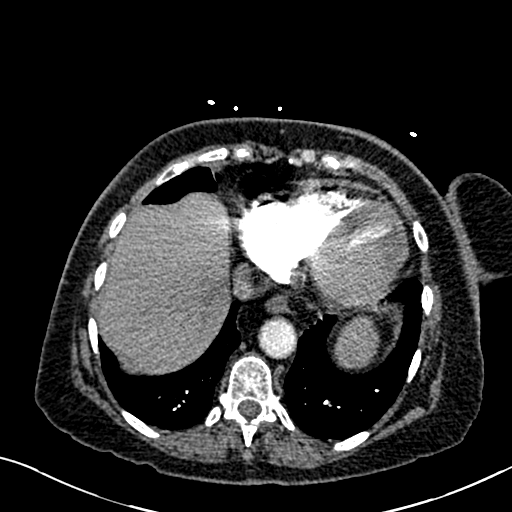
[im 76/288  lung]
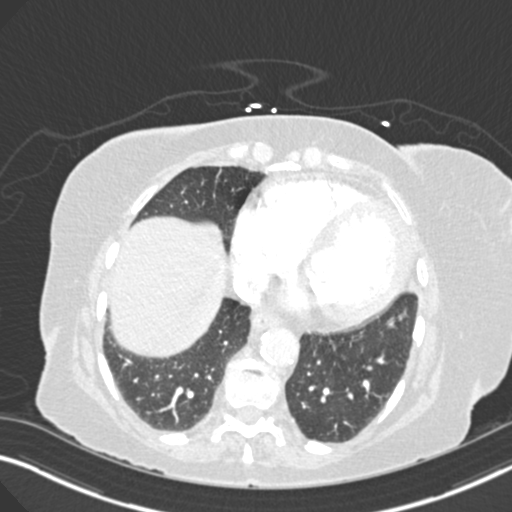
[im 91/288  mediastinal]
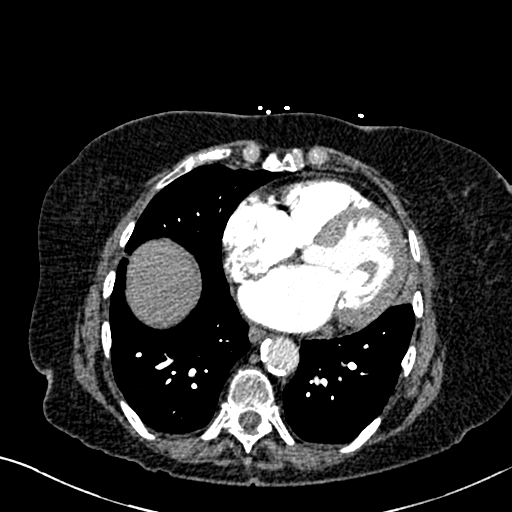
[im 106/288  lung]
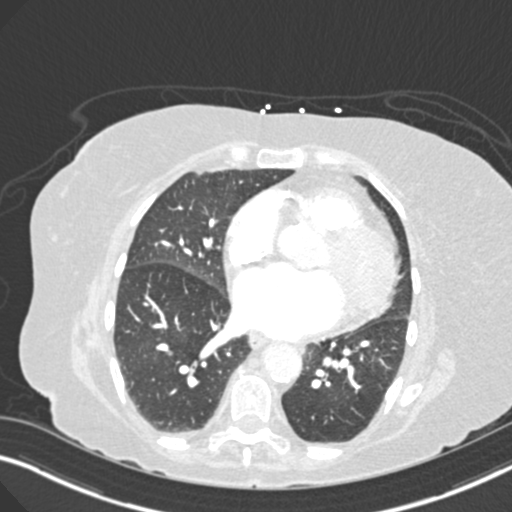
[im 121/288  mediastinal]
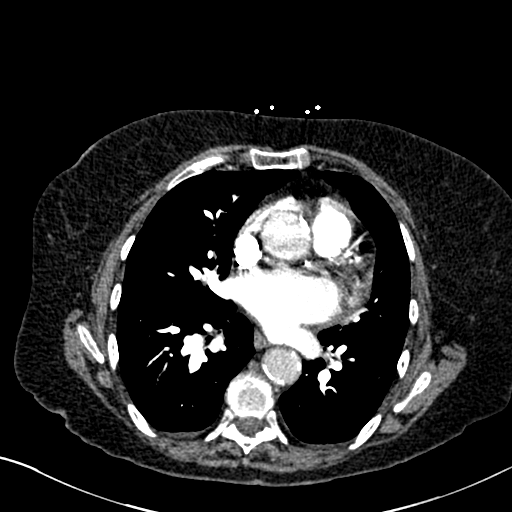
[im 152/288  lung]
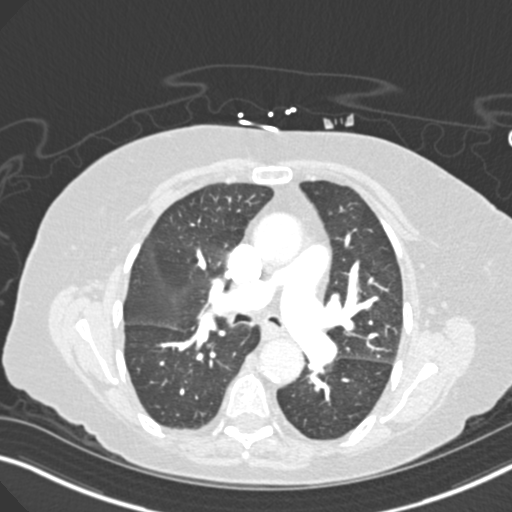
[im 167/288  mediastinal]
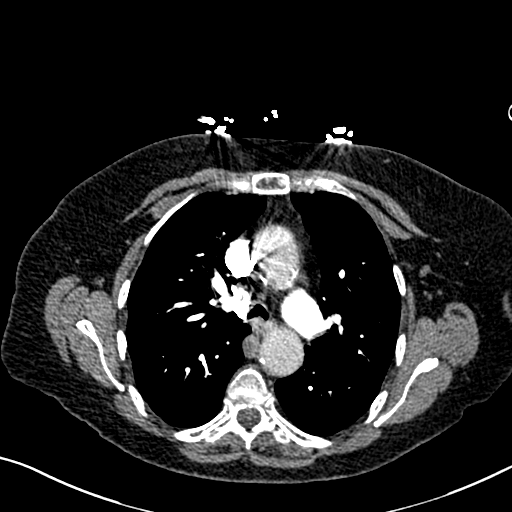
[im 182/288  lung]
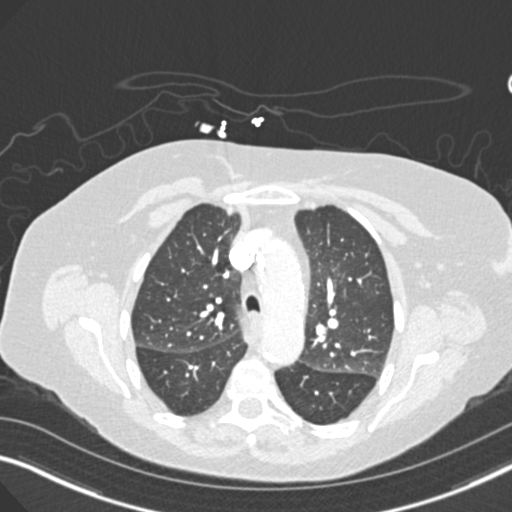
[im 197/288  mediastinal]
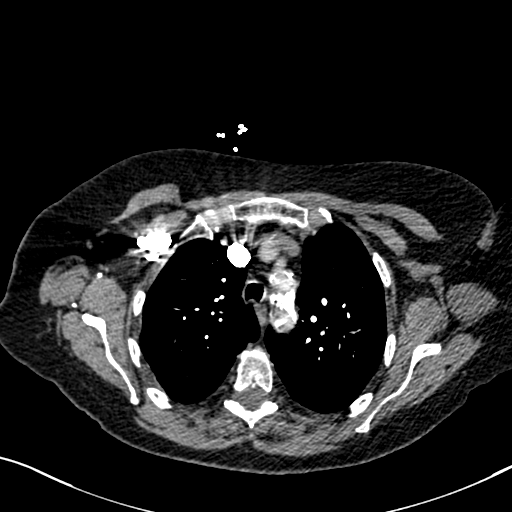
[im 212/288  lung]
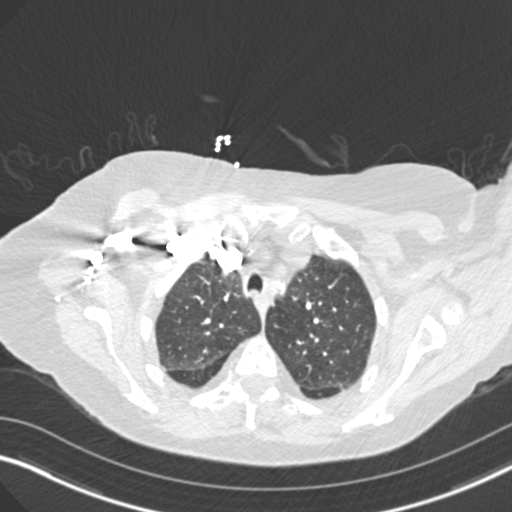
[im 227/288  mediastinal]
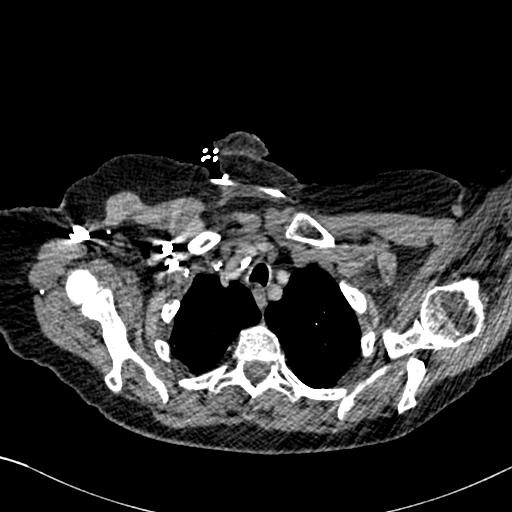
[im 242/288  lung]
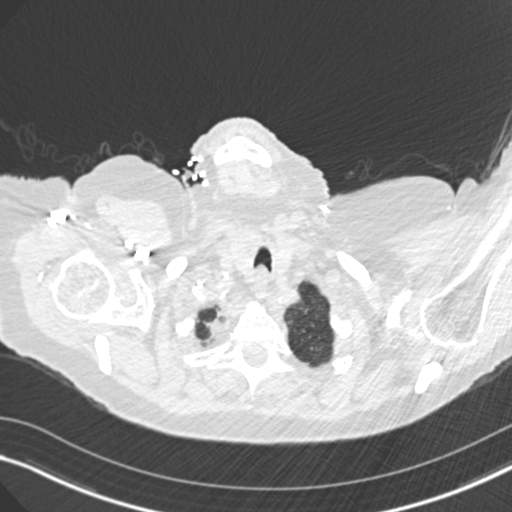
[im 257/288  mediastinal]
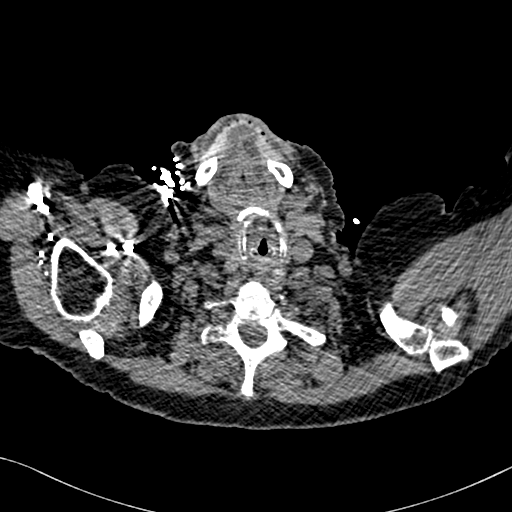
[im 272/288  lung]
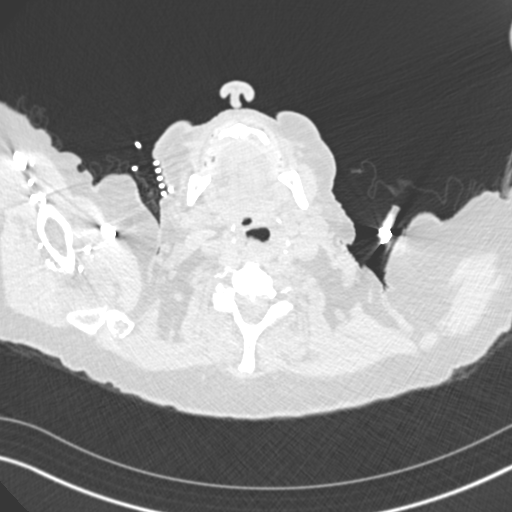

[Series 7: pe coronal mpr · coronal · 0.59mm/px · 1 of 151 slices shown]
[im 76/151  mediastinal]
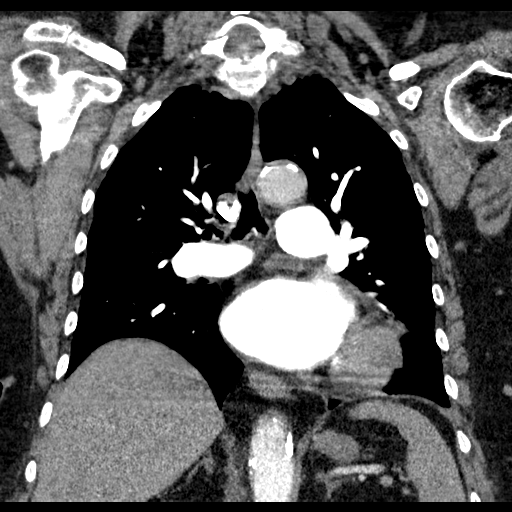

[18 of 36 positions shown; findings below may reference images not displayed]

FINDINGS: Cardiovascular: Satisfactory opacification the pulmonary arteries to
the segmental level. No pulmonary artery filling defects are
identified. Borderline enlargement of the central pulmonary arteries
without elevation of the RV/LV ratio (0.78). Cardiac size at the
upper limits of normal. Coronary artery calcifications are present.
Dense calcifications are noted upon the mitral valve annulus and
within the aortic leaflet. Thoracic aorta is normal caliber and
heavily calcified. Normal 3 vessel branching of the arch. Proximal
great vessels including the subclavian arteries heavily calcified as
well.

Mediastinum/Nodes: No enlarged mediastinal, hilar or axillary lymph
nodes. Thyroid gland and thoracic inlet are unremarkable. No acute
abnormality of the trachea. Small hiatal hernia.

Lungs/Pleura: Mild apical pleuroparenchymal scarring. No
consolidation, features of edema, pneumothorax, or effusion. No
suspicious pulmonary nodules or masses. Dependent atelectasis in the
lung bases.

Upper Abdomen: Stable capsular calcification adjacent the
gallbladder fossa, external to the gallbladder. No acute
abnormalities present in the visualized portions of the upper
abdomen. Mild bilateral symmetric perinephric stranding, a
nonspecific finding though may correlate with either age or
decreased renal function.

Musculoskeletal: No acute osseous abnormality or suspicious osseous
lesion. Multilevel degenerative changes are present in the imaged
portions of the spine. Slight exaggeration of the thoracic kyphosis.
Additional degenerative changes noted in the shoulders, right
greater than left. Patient is edentulous.

Review of the MIP images confirms the above findings.
IMPRESSION: 1. No evidence of pulmonary embolism.
2. Borderline enlargement of the central pulmonary arteries, which
can be seen with pulmonary arterial hypertension.
3. Aortic Atherosclerosis ([id number]-[id number]).

## 2021-02-15 ENCOUNTER — Other Ambulatory Visit: Payer: Self-pay

## 2021-02-15 DIAGNOSIS — I5189 Other ill-defined heart diseases: Secondary | ICD-10-CM | POA: Insufficient documentation

## 2021-02-15 DIAGNOSIS — R600 Localized edema: Secondary | ICD-10-CM | POA: Insufficient documentation

## 2021-02-15 DIAGNOSIS — M79604 Pain in right leg: Secondary | ICD-10-CM | POA: Insufficient documentation

## 2021-02-15 DIAGNOSIS — M79602 Pain in left arm: Secondary | ICD-10-CM | POA: Insufficient documentation

## 2021-02-15 DIAGNOSIS — J449 Chronic obstructive pulmonary disease, unspecified: Secondary | ICD-10-CM | POA: Insufficient documentation

## 2021-02-15 DIAGNOSIS — E559 Vitamin D deficiency, unspecified: Secondary | ICD-10-CM | POA: Insufficient documentation

## 2021-02-15 DIAGNOSIS — L309 Dermatitis, unspecified: Secondary | ICD-10-CM | POA: Insufficient documentation

## 2021-02-15 DIAGNOSIS — E785 Hyperlipidemia, unspecified: Secondary | ICD-10-CM | POA: Insufficient documentation

## 2021-02-15 DIAGNOSIS — M109 Gout, unspecified: Secondary | ICD-10-CM | POA: Insufficient documentation

## 2021-02-15 DIAGNOSIS — M1712 Unilateral primary osteoarthritis, left knee: Secondary | ICD-10-CM | POA: Insufficient documentation

## 2021-02-15 DIAGNOSIS — K279 Peptic ulcer, site unspecified, unspecified as acute or chronic, without hemorrhage or perforation: Secondary | ICD-10-CM | POA: Insufficient documentation

## 2021-02-15 DIAGNOSIS — M79601 Pain in right arm: Secondary | ICD-10-CM | POA: Insufficient documentation

## 2021-02-15 DIAGNOSIS — M8580 Other specified disorders of bone density and structure, unspecified site: Secondary | ICD-10-CM | POA: Insufficient documentation

## 2021-02-15 DIAGNOSIS — N189 Chronic kidney disease, unspecified: Secondary | ICD-10-CM | POA: Insufficient documentation

## 2021-02-16 ENCOUNTER — Ambulatory Visit (INDEPENDENT_AMBULATORY_CARE_PROVIDER_SITE_OTHER): Payer: Medicare Other | Admitting: Cardiology

## 2021-02-16 ENCOUNTER — Other Ambulatory Visit: Payer: Self-pay

## 2021-02-16 ENCOUNTER — Encounter: Payer: Self-pay | Admitting: Cardiology

## 2021-02-16 VITALS — BP 160/80 | HR 62 | Ht 62.0 in | Wt 163.8 lb

## 2021-02-16 DIAGNOSIS — E782 Mixed hyperlipidemia: Secondary | ICD-10-CM | POA: Diagnosis not present

## 2021-02-16 DIAGNOSIS — I480 Paroxysmal atrial fibrillation: Secondary | ICD-10-CM

## 2021-02-16 DIAGNOSIS — I10 Essential (primary) hypertension: Secondary | ICD-10-CM | POA: Diagnosis not present

## 2021-02-16 MED ORDER — RIVAROXABAN 20 MG PO TABS: 20.0000 mg | ORAL_TABLET | Freq: Every day | ORAL | 3 refills | Status: DC

## 2021-02-16 NOTE — Progress Notes (Signed)
Cardiology Office Note:    Date:  02/16/2021   ID:  Tiffany Mitchell, DOB 1930/05/17, MRN [id number]  PCP:  Cyndi Bender, PA-C  Cardiologist:  Jenean Lindau, MD   Referring MD: Cyndi Bender, PA-C    ASSESSMENT:    1. Essential hypertension   2. PAF (paroxysmal atrial fibrillation) ([place])   3. Mixed hyperlipidemia    PLAN:    In order of problems listed above:  1. Primary prevention stressed with the patient.  Importance of compliance with diet medication stressed and she vocalized understanding. 2. [facility] records were reviewed.  Overall from a cardiovascular standpoint they are unremarkable. 3. Essential hypertension: Blood pressure stable and diet is emphasized.  She mentioned to me excellent blood pressure reports at home.  It appears that she has an element of whitecoat hypertension. 4. Atrial fibrillation:I discussed with the patient atrial fibrillation, disease process. Management and therapy including rate and rhythm control, anticoagulation benefits and potential risks were discussed extensively with the patient. Patient had multiple questions which were answered to patient's satisfaction.  She has been taking Xarelto twice a day for some unclear reason.  I told her to take 20 mg daily and she understands written instructions were given. 5. Mixed hyperlipidemia: Diet was emphasized.  Lipids are fine.  They were done a few days ago by primary care. 6. Patient will be seen in follow-up appointment in 6 months or earlier if the patient has any concerns    Medication Adjustments/Labs and Tests Ordered: Current medicines are reviewed at length with the patient today.  Concerns regarding medicines are outlined above.  No orders of the defined types were placed in this encounter.  No orders of the defined types were placed in this encounter.    No chief complaint on file.    History of Present Illness:    Tiffany Mitchell is a 87 y.o. female.  Patient  has past medical history of essential hypertension, paroxysmal atrial fibrillation and mixed dyslipidemia.  She denies any problems at this time and takes care of activities of daily living.  No chest pain orthopnea or PND.  It has been a year since she quit smoking.  At the time of my evaluation, the patient is alert awake oriented and in no distress.  She had a mechanical fall in November and went to the hospital and admitted to the records.  Past Medical History:  Diagnosis Date  . Bilateral leg edema   . Bilateral leg edema   . Calculus of gallbladder with chronic cholecystitis without obstruction 10/12/2016  . Chronic kidney disease 07/09/2015  . Cigarette smoker 07/09/2015  . CKD (chronic kidney disease)   . COPD (chronic obstructive pulmonary disease) ([place])   . Cystocele 02/12/2016  . Diastolic dysfunction   . DOE (dyspnea on exertion) 11/01/2019  . Eczema   . Essential hypertension 07/09/2015  . Gout   . Hypercalcemia   . Hyperlipidemia   . Lumbar pain with radiation down right leg   . Lumbar pain with radiation down right leg   . Moderate mitral insufficiency 07/09/2015  . Osteoarthritis of left knee   . Osteopenia   . PAF (paroxysmal atrial fibrillation) ([place]) 01/22/2020  . Pain in both upper extremities   . Pedal edema 11/01/2019  . Peptic ulcer disease   . Personal history of transient ischemic attack (TIA), and cerebral infarction without residual deficits 07/09/2015  . Postoperative examination 02/12/2016  . Pure hypercholesterolemia 07/09/2015  . Stress incontinence in  female 02/12/2016  . Vitamin D deficiency     Past Surgical History:  Procedure Laterality Date  . ABDOMINAL HYSTERECTOMY    . APPENDECTOMY    . KIDNEY SURGERY    . OVARIAN CYST SURGERY    . TUBAL LIGATION      Current Medications: Current Meds  Medication Sig  . acetaminophen (TYLENOL) 650 MG CR tablet Take 650 mg by mouth every 8 (eight) hours as needed for pain.  Marland Kitchen albuterol (VENTOLIN HFA) 108 (90  Base) MCG/ACT inhaler Inhale 1-2 puffs into the lungs every 4 (four) hours as needed for shortness of breath.  . allopurinol (ZYLOPRIM) 100 MG tablet Take 200 mg by mouth daily.  . carvedilol (COREG) 6.25 MG tablet Take 6.25 mg by mouth 2 (two) times daily.  . Cholecalciferol (VITAMIN D3) 50 MCG (2000 UT) capsule Take 2,000 Units by mouth daily.  . digoxin (LANOXIN) 0.125 MG tablet TAKE 1 TABLET(0.125 MG) BY MOUTH DAILY  . doxazosin (CARDURA) 8 MG tablet Take 8 mg by mouth at bedtime.  . enalapril (VASOTEC) 20 MG tablet Take 20 mg by mouth 2 (two) times daily.  . pantoprazole (PROTONIX) 40 MG tablet Take 40 mg by mouth daily.  . pravastatin (PRAVACHOL) 40 MG tablet Take 40 mg by mouth at bedtime.  . Prenatal Vit-Fe Fumarate-FA (PNV PRENATAL PLUS MULTIVITAMIN) 27-1 MG TABS Take 1 tablet by mouth daily.  . Rivaroxaban (XARELTO) 15 MG TABS tablet Take 1 tablet (15 mg total) by mouth 2 (two) times daily with a meal.  . tiotropium (SPIRIVA) 18 MCG inhalation capsule Place 18 mcg into inhaler and inhale daily in the afternoon.  . torsemide (DEMADEX) 20 MG tablet Take 20 mg by mouth every morning.  . traZODone (DESYREL) 50 MG tablet Take 50 mg by mouth at bedtime.  . vitamin B-12 (CYANOCOBALAMIN) 100 MCG tablet Take 100 mcg by mouth daily.     Allergies:   Amlodipine and Hydrocodone-acetaminophen   Social History   Socioeconomic History  . Marital status: Single    Spouse name: Not on file  . Number of children: Not on file  . Years of education: Not on file  . Highest education level: Not on file  Occupational History  . Not on file  Tobacco Use  . Smoking status: Former Smoker    Packs/day: 0.25    Types: Cigarettes    Quit date: 01/19/2020    Years since quitting: 1.0  . Smokeless tobacco: Never Used  Substance and Sexual Activity  . Alcohol use: Never  . Drug use: Never  . Sexual activity: Not on file  Other Topics Concern  . Not on file  Social History Narrative  . Not on file    Social Determinants of Health   Financial Resource Strain: Not on file  Food Insecurity: Not on file  Transportation Needs: Not on file  Physical Activity: Not on file  Stress: Not on file  Social Connections: Not on file     Family History: The patient's family history includes Diabetes in her sister.  ROS:   Please see the history of present illness.    All other systems reviewed and are negative.  EKGs/Labs/Other Studies Reviewed:    The following studies were reviewed today: [facility] admission and records were reviewed extensively.   Recent Labs: 03/04/2020: Hemoglobin 10.3; Platelets 229 05/06/2020: BUN 16; Creatinine, Ser 1.05; Potassium 4.2; Sodium 146  Recent Lipid Panel No results found for: CHOL, TRIG, HDL, CHOLHDL, VLDL, LDLCALC, LDLDIRECT  Physical Exam:    VS:  BP (!) 160/80   Pulse 62   Ht 5\' 2"  (1.575 m)   Wt 163 lb 12.8 oz (74.3 kg)   SpO2 95%   BMI 29.96 kg/m     Wt Readings from Last 3 Encounters:  02/16/21 163 lb 12.8 oz (74.3 kg)  08/18/20 164 lb 12.8 oz (74.8 kg)  05/06/20 169 lb (76.7 kg)     GEN: Patient is in no acute distress HEENT: Normal NECK: No JVD; No carotid bruits LYMPHATICS: No lymphadenopathy CARDIAC: Hear sounds regular, 2/6 systolic murmur at the apex. RESPIRATORY:  Clear to auscultation without rales, wheezing or rhonchi  ABDOMEN: Soft, non-tender, non-distended MUSCULOSKELETAL:  No edema; No deformity  SKIN: Warm and dry NEUROLOGIC:  Alert and oriented x 3 PSYCHIATRIC:  Normal affect   Signed, Jenean Lindau, MD  02/16/2021 10:55 AM    [place]

## 2021-02-16 NOTE — Patient Instructions (Signed)
Medication Instructions:  Your physician has recommended you make the following change in your medication:   Take Xarelto 20 mg once daily with your evening meal.  *If you need a refill on your cardiac medications before your next appointment, please call your pharmacy*   Lab Work: None ordered If you have labs (blood work) drawn today and your tests are completely normal, you will receive your results only by: Marland Kitchen MyChart Message (if you have MyChart) OR . A paper copy in the mail If you have any lab test that is abnormal or we need to change your treatment, we will call you to review the results.   Testing/Procedures: None ordered   Follow-Up: At [facility], you and your health needs are our priority.  As part of our continuing mission to provide you with exceptional heart care, we have created designated Provider Care Teams.  These Care Teams include your primary Cardiologist (physician) and Advanced Practice Providers (APPs -  Physician Assistants and Nurse Practitioners) who all work together to provide you with the care you need, when you need it.  We recommend signing up for the patient portal called "MyChart".  Sign up information is provided on this After Visit Summary.  MyChart is used to connect with patients for Virtual Visits (Telemedicine).  Patients are able to view lab/test results, encounter notes, upcoming appointments, etc.  Non-urgent messages can be sent to your provider as well.   To learn more about what you can do with MyChart, go to [email or web address].    Your next appointment:   6 month(s)  The format for your next appointment:   In Person  Provider:   Jyl Heinz, MD   Other Instructions NA

## 2021-02-23 ENCOUNTER — Other Ambulatory Visit: Payer: Self-pay | Admitting: Cardiology

## 2021-03-01 ENCOUNTER — Other Ambulatory Visit: Payer: Self-pay | Admitting: Cardiology

## 2021-03-01 DIAGNOSIS — D649 Anemia, unspecified: Secondary | ICD-10-CM

## 2021-08-18 ENCOUNTER — Encounter: Payer: Self-pay | Admitting: Cardiology

## 2021-08-18 ENCOUNTER — Other Ambulatory Visit: Payer: Self-pay

## 2021-08-18 ENCOUNTER — Ambulatory Visit (INDEPENDENT_AMBULATORY_CARE_PROVIDER_SITE_OTHER): Payer: Medicare Other | Admitting: Cardiology

## 2021-08-18 VITALS — BP 162/88 | HR 56 | Ht 62.0 in | Wt 156.2 lb

## 2021-08-18 DIAGNOSIS — I340 Nonrheumatic mitral (valve) insufficiency: Secondary | ICD-10-CM | POA: Diagnosis not present

## 2021-08-18 DIAGNOSIS — I480 Paroxysmal atrial fibrillation: Secondary | ICD-10-CM

## 2021-08-18 DIAGNOSIS — I10 Essential (primary) hypertension: Secondary | ICD-10-CM | POA: Diagnosis not present

## 2021-08-18 DIAGNOSIS — E782 Mixed hyperlipidemia: Secondary | ICD-10-CM | POA: Diagnosis not present

## 2021-08-18 NOTE — Progress Notes (Signed)
Cardiology Office Note:    Date:  08/18/2021   ID:  Tiffany Mitchell, DOB 1930-08-24, MRN [id number]  PCP:  Cyndi Bender, PA-C  Cardiologist:  Jenean Lindau, MD   Referring MD: Cyndi Bender, PA-C    ASSESSMENT:    1. Essential hypertension   2. Mixed hyperlipidemia   3. Moderate mitral insufficiency   4. PAF (paroxysmal atrial fibrillation) (HCC)    PLAN:    In order of problems listed above:  Primary prevention stressed with the patient.  Importance of compliance with diet medication stressed and she vocalized understanding.  She was advised to walk on a regular basis and keep active and she promises to do so.   Essential hypertension: Her blood pressures are stable at home.  She has an element of whitecoat hypertension.  She keeps a track of her blood pressures at home.  Salt intake issues and such issues were discussed. Mixed dyslipidemia: Diet emphasized.  Lifestyle modification urged numbers from [facility] sheet were discussed with her. Paroxysmal atrial fibrillation:I discussed with the patient atrial fibrillation, disease process. Management and therapy including rate and rhythm control, anticoagulation benefits and potential risks were discussed extensively with the patient. Patient had multiple questions which were answered to patient's satisfaction.  Moderate mitral regurgitation: Stable at this time and we will continue to monitor. Patient will be seen in follow-up appointment in 6 months or earlier if the patient has any concerns    Medication Adjustments/Labs and Tests Ordered: Current medicines are reviewed at length with the patient today.  Concerns regarding medicines are outlined above.  No orders of the defined types were placed in this encounter.  No orders of the defined types were placed in this encounter.    No chief complaint on file.    History of Present Illness:    Tiffany Mitchell is a 87 y.o. female.  Patient has past medical history of  essential hypertension, paroxysmal atrial fibrillation mixed dyslipidemia and moderate mitral regurgitation.  She denies any problems at this time and takes care of activities of daily living.  No chest pain orthopnea or PND.  She is active more than what I could expect for somebody her age.  She drove to the clinic today.  No history of any unsteady gait or falls or any such problems.  At the time of my evaluation, the patient is alert awake oriented and in no distress.  She mentions to me that she quit smoking 2 years ago.  Past Medical History:  Diagnosis Date   Bilateral leg edema    Bilateral leg edema    Calculus of gallbladder with chronic cholecystitis without obstruction 10/12/2016   Chronic kidney disease 07/09/2015   Cigarette smoker 07/09/2015   CKD (chronic kidney disease)    COPD (chronic obstructive pulmonary disease) ([place])    Cystocele 99991111   Diastolic dysfunction    DOE (dyspnea on exertion) 11/01/2019   Eczema    Essential hypertension 07/09/2015   Gout    Hypercalcemia    Hyperlipidemia    Lumbar pain with radiation down right leg    Lumbar pain with radiation down right leg    Moderate mitral insufficiency 07/09/2015   Osteoarthritis of left knee    Osteopenia    PAF (paroxysmal atrial fibrillation) ([place]) 01/22/2020   Pain in both upper extremities    Pedal edema 11/01/2019   Peptic ulcer disease    Personal history of transient ischemic attack (TIA), and cerebral infarction without residual deficits  07/09/2015   Postoperative examination 02/12/2016   Pure hypercholesterolemia 07/09/2015   Stress incontinence in female 02/12/2016   Vitamin D deficiency     Past Surgical History:  Procedure Laterality Date   ABDOMINAL HYSTERECTOMY     APPENDECTOMY     KIDNEY SURGERY     OVARIAN CYST SURGERY     TUBAL LIGATION      Current Medications: Current Meds  Medication Sig   acetaminophen (TYLENOL) 650 MG CR tablet Take 650 mg by mouth every 8 (eight) hours as needed  for pain.   albuterol (VENTOLIN HFA) 108 (90 Base) MCG/ACT inhaler Inhale 1-2 puffs into the lungs every 4 (four) hours as needed for shortness of breath.   allopurinol (ZYLOPRIM) 100 MG tablet Take 200 mg by mouth daily.   carvedilol (COREG) 6.25 MG tablet Take 6.25 mg by mouth 2 (two) times daily.   Cholecalciferol (VITAMIN D3) 50 MCG (2000 UT) capsule Take 2,000 Units by mouth daily.   digoxin (LANOXIN) 0.125 MG tablet TAKE 1 TABLET(0.125 MG) BY MOUTH DAILY   doxazosin (CARDURA) 8 MG tablet Take 8 mg by mouth at bedtime.   enalapril (VASOTEC) 20 MG tablet Take 20 mg by mouth 2 (two) times daily.   pantoprazole (PROTONIX) 40 MG tablet Take 40 mg by mouth daily.   pravastatin (PRAVACHOL) 40 MG tablet Take 40 mg by mouth at bedtime.   Prenatal Vit-Fe Fumarate-FA (PNV PRENATAL PLUS MULTIVITAMIN) 27-1 MG TABS Take 1 tablet by mouth daily.   Rivaroxaban (XARELTO) 20 MG TABS tablet Take 1 tablet (20 mg total) by mouth daily with supper.   tiotropium (SPIRIVA) 18 MCG inhalation capsule Place 18 mcg into inhaler and inhale daily in the afternoon.   torsemide (DEMADEX) 10 MG tablet Take 10 mg by mouth every morning.   traZODone (DESYREL) 50 MG tablet Take 50 mg by mouth at bedtime.   vitamin B-12 (CYANOCOBALAMIN) 100 MCG tablet Take 100 mcg by mouth daily.     Allergies:   Amlodipine and Hydrocodone-acetaminophen   Social History   Socioeconomic History   Marital status: Single    Spouse name: Not on file   Number of children: Not on file   Years of education: Not on file   Highest education level: Not on file  Occupational History   Not on file  Tobacco Use   Smoking status: Former    Packs/day: 0.25    Types: Cigarettes    Quit date: 01/19/2020    Years since quitting: 1.5   Smokeless tobacco: Never  Substance and Sexual Activity   Alcohol use: Never   Drug use: Never   Sexual activity: Not on file  Other Topics Concern   Not on file  Social History Narrative   Not on file    Social Determinants of Health   Financial Resource Strain: Not on file  Food Insecurity: Not on file  Transportation Needs: Not on file  Physical Activity: Not on file  Stress: Not on file  Social Connections: Not on file     Family History: The patient's family history includes Diabetes in her sister.  ROS:   Please see the history of present illness.    All other systems reviewed and are negative.  EKGs/Labs/Other Studies Reviewed:    The following studies were reviewed today: I discussed my findings with the patient at length.   Recent Labs: No results found for requested labs within last 8760 hours.  Recent Lipid Panel No results found for: CHOL,  TRIG, HDL, CHOLHDL, VLDL, LDLCALC, LDLDIRECT  Physical Exam:    VS:  BP (!) 162/88   Pulse (!) 56   Ht '5\' 2"'$  (1.575 m)   Wt 156 lb 3.2 oz (70.9 kg)   SpO2 98%   BMI 28.57 kg/m     Wt Readings from Last 3 Encounters:  08/18/21 156 lb 3.2 oz (70.9 kg)  02/16/21 163 lb 12.8 oz (74.3 kg)  08/18/20 164 lb 12.8 oz (74.8 kg)     GEN: Patient is in no acute distress HEENT: Normal NECK: No JVD; No carotid bruits LYMPHATICS: No lymphadenopathy CARDIAC: Hear sounds regular, 2/6 systolic murmur at the apex. RESPIRATORY:  Clear to auscultation without rales, wheezing or rhonchi  ABDOMEN: Soft, non-tender, non-distended MUSCULOSKELETAL:  No edema; No deformity  SKIN: Warm and dry NEUROLOGIC:  Alert and oriented x 3 PSYCHIATRIC:  Normal affect   Signed, Jenean Lindau, MD  08/18/2021 10:22 AM    [facility]

## 2021-08-18 NOTE — Patient Instructions (Signed)

## 2021-10-05 ENCOUNTER — Other Ambulatory Visit: Payer: Self-pay | Admitting: Cardiology

## 2021-12-30 ENCOUNTER — Other Ambulatory Visit: Payer: Self-pay | Admitting: Cardiology

## 2021-12-30 ENCOUNTER — Other Ambulatory Visit: Payer: Self-pay

## 2021-12-30 MED ORDER — RIVAROXABAN 15 MG PO TABS: 15.0000 mg | ORAL_TABLET | Freq: Every day | ORAL | 1 refills | Status: DC

## 2021-12-30 NOTE — Telephone Encounter (Signed)
Prescription refill request for Xarelto received.  Indication:Afib Last office visit:8/22 Weight:70.9 kg Age:88 Scr:1.4 CrCl:30.49  Review for dosing

## 2022-01-13 ENCOUNTER — Telehealth: Payer: Self-pay

## 2022-01-13 DIAGNOSIS — Z79899 Other long term (current) drug therapy: Secondary | ICD-10-CM

## 2022-01-13 DIAGNOSIS — Z5181 Encounter for therapeutic drug level monitoring: Secondary | ICD-10-CM

## 2022-01-13 NOTE — Telephone Encounter (Signed)
Pt advised to come for a repeat digoxin level on 01/18/22. Pt verbalized understanding and had no additional questions.

## 2022-02-23 ENCOUNTER — Other Ambulatory Visit: Payer: Self-pay

## 2022-02-24 ENCOUNTER — Ambulatory Visit (INDEPENDENT_AMBULATORY_CARE_PROVIDER_SITE_OTHER): Payer: Medicare Other | Admitting: Cardiology

## 2022-02-24 ENCOUNTER — Other Ambulatory Visit: Payer: Self-pay

## 2022-02-24 ENCOUNTER — Telehealth: Payer: Self-pay | Admitting: *Deleted

## 2022-02-24 ENCOUNTER — Encounter: Payer: Self-pay | Admitting: Cardiology

## 2022-02-24 VITALS — BP 138/72 | HR 83 | Ht 60.0 in | Wt 151.6 lb

## 2022-02-24 DIAGNOSIS — I480 Paroxysmal atrial fibrillation: Secondary | ICD-10-CM

## 2022-02-24 DIAGNOSIS — I340 Nonrheumatic mitral (valve) insufficiency: Secondary | ICD-10-CM

## 2022-02-24 DIAGNOSIS — Z5181 Encounter for therapeutic drug level monitoring: Secondary | ICD-10-CM

## 2022-02-24 DIAGNOSIS — E782 Mixed hyperlipidemia: Secondary | ICD-10-CM

## 2022-02-24 DIAGNOSIS — I10 Essential (primary) hypertension: Secondary | ICD-10-CM

## 2022-02-24 DIAGNOSIS — Z79899 Other long term (current) drug therapy: Secondary | ICD-10-CM

## 2022-02-24 DIAGNOSIS — I350 Nonrheumatic aortic (valve) stenosis: Secondary | ICD-10-CM | POA: Insufficient documentation

## 2022-02-24 HISTORY — DX: Nonrheumatic aortic (valve) stenosis: I35.0

## 2022-02-24 NOTE — Patient Instructions (Signed)
Medication Instructions:  ?Will call patient to verify medications on her list this afternoon  ?*If you need a refill on your cardiac medications before your next appointment, please call your pharmacy* ? ? ?Lab Work: ?BMET, Dig ?If you have labs (blood work) drawn today and your tests are completely normal, you will receive your results only by: ?MyChart Message (if you have MyChart) OR ?A paper copy in the mail ?If you have any lab test that is abnormal or we need to change your treatment, we will call you to review the results. ? ? ?Testing/Procedures: ?Keep a log of your blood pressure and pulse rates and bring back in 2 weeks. ?Blood Pressure Record Sheet ?To take your blood pressure, you will need a blood pressure machine. You can buy a blood pressure machine (blood pressure monitor) at your clinic, drug store, or online. When choosing one, consider: ?An automatic monitor that has an arm cuff. ?A cuff that wraps snugly around your upper arm. You should be able to fit only one finger between your arm and the cuff. ?A device that stores blood pressure reading results. ?Do not choose a monitor that measures your blood pressure from your wrist or finger. ?Follow your health care provider's instructions for how to take your blood pressure. To use this form: ?Get one reading in the morning (a.m.) 1-2 hours after you take any medicines. ?Get one reading in the evening (p.m.) before supper. ?Take at least 2 readings with each blood pressure check. This makes sure the results are correct. Wait 1-2 minutes between measurements. ?Write down the results in the spaces on this form. ?Repeat this once a week, or as told by your health care provider. ? ?Make a follow-up appointment with your health care provider to discuss the results. ?Blood pressure log ?Date: _______________________ ?a.m. _____________________(1st reading) HR___________ ? ?          p.m. _____________________(2nd reading) HR__________ ? ?Date:  _______________________ ?a.m. _____________________(1st reading) HR___________ ? ?          p.m. _____________________(2nd reading) HR__________ ?Date: _______________________ ?a.m. _____________________(1st reading) HR___________ ? ?          p.m. _____________________(2nd reading) HR__________ ?Date: _______________________ ?a.m. _____________________(1st reading) HR___________ ? ?          p.m. _____________________(2nd reading) HR__________ ? ?Date: _______________________ ?a.m. _____________________(1st reading) HR___________ ? ?          p.m. _____________________(2nd reading) HR__________ ? ?Date: _______________________ ?a.m. _____________________(1st reading) HR___________ ? ?          p.m. _____________________(2nd reading) HR__________ ? ?Date: _______________________ ?a.m. _____________________(1st reading) HR___________ ? ?          p.m. _____________________(2nd reading) HR__________ ? ? ?This information is not intended to replace advice given to you by your health care provider. Make sure you discuss any questions you have with your health care provider. ?Document Revised: 04/01/2020 Document Reviewed: 04/01/2020 ?Elsevier Patient Education ? [employer].  ? ?Follow-Up: ?At [facility], you and your health needs are our priority.  As part of our continuing mission to provide you with exceptional heart care, we have created designated Provider Care Teams.  These Care Teams include your primary Cardiologist (physician) and Advanced Practice Providers (APPs -  Physician Assistants and Nurse Practitioners) who all work together to provide you with the care you need, when you need it. ? ?We recommend signing up for the patient portal called "MyChart".  Sign up information is provided on this After Visit Summary.  MyChart is used to connect with patients for Virtual Visits (Telemedicine).  Patients are able to view lab/test results, encounter notes, upcoming appointments, etc.  Non-urgent messages  can be sent to your provider as well.   ?To learn more about what you can do with MyChart, go to [email or web address].   ? ?Your next appointment:   ?2 month(s) ? ?The format for your next appointment:   ?In Person ? ?Provider:   ?Jyl Heinz, MD  ? ? ?Other Instructions ? ? ?

## 2022-02-24 NOTE — Progress Notes (Signed)
?Cardiology Office Note:   ? ?Date:  02/24/2022  ? ?ID:  Tiffany Mitchell, DOB Jun 05, 1930, MRN [id number] ? ?PCP:  Cyndi Bender, PA-C  ?Cardiologist:  Jenean Lindau, MD  ? ?Referring MD: Cyndi Bender, PA-C  ? ? ?ASSESSMENT:   ? ?1. Essential hypertension   ?2. Mixed hyperlipidemia   ?3. Moderate mitral insufficiency   ?4. PAF (paroxysmal atrial fibrillation) ([place])   ?5. Aortic valve stenosis, etiology of cardiac valve disease unspecified   ? ?PLAN:   ? ?In order of problems listed above: ? ?Primary prevention stressed to the patient.  Importance of compliance with diet medication stressed and she vocalized understanding. ?Atrial fibrillation: Stable.I discussed with the patient atrial fibrillation, disease process. Management and therapy including rate and rhythm control, anticoagulation benefits and potential risks were discussed extensively with the patient. Patient had multiple questions which were answered to patient's satisfaction. ?Mild to moderate aortic stenosis: Stable stable and asymptomatic at this time.  Educated the patient about her symptomatology. ?Essential hypertension: Blood pressure stable.  I will cut down on her medications in view of her history that she has given me as I mentioned below.  I would like to make sure that she is not getting hypotensive at times.  She will go home and give [place] a call about list of her medications. ?Moderate mitral regurgitation: Stable at this time.  Medical management. ?Patient will be seen in follow-up appointment in 6 months or earlier if the patient has any concerns ? ? ? ?Medication Adjustments/Labs and Tests Ordered: ?Current medicines are reviewed at length with the patient today.  Concerns regarding medicines are outlined above.  ?No orders of the defined types were placed in this encounter. ? ?No orders of the defined types were placed in this encounter. ? ? ? ?No chief complaint on file. ?  ? ?History of Present Illness:   ? ?Tiffany Mitchell is a  88 y.o. female.  Patient has past medical history of essential hypertension mild to moderate aortic stenosis, moderate mitral regurgitation and paroxysmal atrial fibrillation.  She denies any problems at this time and takes care of activities of daily living.  No chest pain orthopnea or PND.  Patient mentions to me that occasionally she feels like his.  This happens especially when she is walking.  Never had a syncopal event.  She has renal insufficiency.  At the time of my evaluation, the patient is alert awake oriented and in no distress. ? ?Past Medical History:  ?Diagnosis Date  ? Bilateral leg edema   ? Bilateral leg edema   ? Calculus of gallbladder with chronic cholecystitis without obstruction 10/12/2016  ? Chronic kidney disease 07/09/2015  ? Cigarette smoker 07/09/2015  ? CKD (chronic kidney disease)   ? COPD (chronic obstructive pulmonary disease) ([place])   ? Cystocele 02/12/2016  ? Diastolic dysfunction   ? DOE (dyspnea on exertion) 11/01/2019  ? Eczema   ? Essential hypertension 07/09/2015  ? Gout   ? Hypercalcemia   ? Hyperlipidemia   ? Lumbar pain with radiation down right leg   ? Lumbar pain with radiation down right leg   ? Moderate mitral insufficiency 07/09/2015  ? Osteoarthritis of left knee   ? Osteopenia   ? PAF (paroxysmal atrial fibrillation) ([place]) 01/22/2020  ? Pain in both upper extremities   ? Pedal edema 11/01/2019  ? Peptic ulcer disease   ? Personal history of transient ischemic attack (TIA), and cerebral infarction without residual deficits 07/09/2015  ?  Postoperative examination 02/12/2016  ? Pure hypercholesterolemia 07/09/2015  ? Stress incontinence in female 02/12/2016  ? Vitamin D deficiency   ? ? ?Past Surgical History:  ?Procedure Laterality Date  ? ABDOMINAL HYSTERECTOMY    ? APPENDECTOMY    ? KIDNEY SURGERY    ? OVARIAN CYST SURGERY    ? TUBAL LIGATION    ? ? ?Current Medications: ?Current Meds  ?Medication Sig  ? acetaminophen (TYLENOL) 650 MG CR tablet Take 650 mg by mouth every 8 (eight)  hours as needed for pain.  ? albuterol (VENTOLIN HFA) 108 (90 Base) MCG/ACT inhaler Inhale 1-2 puffs into the lungs every 4 (four) hours as needed for shortness of breath.  ? allopurinol (ZYLOPRIM) 100 MG tablet Take 200 mg by mouth daily.  ? carvedilol (COREG) 6.25 MG tablet Take 6.25 mg by mouth 2 (two) times daily.  ? Cholecalciferol (VITAMIN D3) 50 MCG (2000 UT) capsule Take 2,000 Units by mouth daily.  ? digoxin (LANOXIN) 0.125 MG tablet Take 0.125 mg by mouth 3 (three) times a week.  ? doxazosin (CARDURA) 8 MG tablet Take 8 mg by mouth at bedtime.  ? enalapril (VASOTEC) 20 MG tablet Take 20 mg by mouth 2 (two) times daily.  ? losartan (COZAAR) 100 MG tablet Take 100 mg by mouth daily.  ? pantoprazole (PROTONIX) 40 MG tablet Take 40 mg by mouth daily.  ? pravastatin (PRAVACHOL) 40 MG tablet Take 40 mg by mouth at bedtime.  ? Prenatal Vit-Fe Fumarate-FA (PNV PRENATAL PLUS MULTIVITAMIN) 27-1 MG TABS Take 1 tablet by mouth daily.  ? rivaroxaban (XARELTO) 15 MG TABS tablet Take 1 tablet (15 mg total) by mouth daily with supper.  ? tiotropium (SPIRIVA) 18 MCG inhalation capsule Place 18 mcg into inhaler and inhale daily in the afternoon.  ? torsemide (DEMADEX) 10 MG tablet Take 10 mg by mouth every morning.  ? traZODone (DESYREL) 100 MG tablet Take 100 mg by mouth at bedtime.  ? TRELEGY ELLIPTA 100-62.5-25 MCG/ACT AEPB Take 1 puff by mouth daily.  ? vitamin B-12 (CYANOCOBALAMIN) 100 MCG tablet Take 100 mcg by mouth daily.  ?  ? ?Allergies:   Amlodipine and Hydrocodone-acetaminophen  ? ?Social History  ? ?Socioeconomic History  ? Marital status: Single  ?  Spouse name: Not on file  ? Number of children: Not on file  ? Years of education: Not on file  ? Highest education level: Not on file  ?Occupational History  ? Not on file  ?Tobacco Use  ? Smoking status: Former  ?  Packs/day: 0.25  ?  Types: Cigarettes  ?  Quit date: 01/19/2020  ?  Years since quitting: 2.1  ? Smokeless tobacco: Never  ?Substance and Sexual  Activity  ? Alcohol use: Never  ? Drug use: Never  ? Sexual activity: Not on file  ?Other Topics Concern  ? Not on file  ?Social History Narrative  ? Not on file  ? ?Social Determinants of Health  ? ?Financial Resource Strain: Not on file  ?Food Insecurity: Not on file  ?Transportation Needs: Not on file  ?Physical Activity: Not on file  ?Stress: Not on file  ?Social Connections: Not on file  ?  ? ?Family History: ?The patient's family history includes Diabetes in her sister. ? ?ROS:   ?Please see the history of present illness.    ?All other systems reviewed and are negative. ? ?EKGs/Labs/Other Studies Reviewed:   ? ?The following studies were reviewed today: ?I discussed my findings with the  patient at length.  EKG reveals atrial fibrillation. ? ? ?Recent Labs: ?No results found for requested labs within last 8760 hours.  ?Recent Lipid Panel ?No results found for: CHOL, TRIG, HDL, CHOLHDL, VLDL, LDLCALC, LDLDIRECT ? ?Physical Exam:   ? ?VS:  BP 138/72   Pulse 83   Ht 5' (1.524 m)   Wt 151 lb 9.6 oz (68.8 kg)   SpO2 97%   BMI 29.61 kg/m?    ? ?Wt Readings from Last 3 Encounters:  ?02/24/22 151 lb 9.6 oz (68.8 kg)  ?08/18/21 156 lb 3.2 oz (70.9 kg)  ?02/16/21 163 lb 12.8 oz (74.3 kg)  ?  ? ?GEN: Patient is in no acute distress ?HEENT: Normal ?NECK: No JVD; No carotid bruits ?LYMPHATICS: No lymphadenopathy ?CARDIAC: Hear sounds regular, 2/6 systolic murmur at the apex. ?RESPIRATORY:  Clear to auscultation without rales, wheezing or rhonchi  ?ABDOMEN: Soft, non-tender, non-distended ?MUSCULOSKELETAL:  No edema; No deformity  ?SKIN: Warm and dry ?NEUROLOGIC:  Alert and oriented x 3 ?PSYCHIATRIC:  Normal affect  ? ?Signed, ?Jenean Lindau, MD  ?02/24/2022 11:44 AM    ?[place]  ?

## 2022-02-24 NOTE — Telephone Encounter (Signed)
Called to go over medication list but pt was not home. Will call back tomorrow ?

## 2022-02-25 ENCOUNTER — Telehealth: Payer: Self-pay

## 2022-02-25 LAB — BASIC METABOLIC PANEL
BUN/Creatinine Ratio: 23 (ref 12–28)
BUN: 40 mg/dL — ABNORMAL HIGH (ref 8–27)
CO2: 27 mmol/L (ref 20–29)
Calcium: 9.7 mg/dL (ref 8.7–10.3)
Chloride: 101 mmol/L (ref 96–106)
Creatinine, Ser: 1.76 mg/dL — ABNORMAL HIGH (ref 0.57–1.00)
Glucose: 87 mg/dL (ref 70–99)
Potassium: 5.1 mmol/L (ref 3.5–5.2)
Sodium: 143 mmol/L (ref 134–144)
eGFR: 27 mL/min/{1.73_m2} — ABNORMAL LOW (ref >59)

## 2022-02-25 LAB — DIGOXIN LEVEL: Digoxin, Serum: 0.9 ng/mL (ref 0.5–0.9)

## 2022-02-25 NOTE — Telephone Encounter (Signed)
Called and updated patient's medication list. ?

## 2022-03-11 ENCOUNTER — Ambulatory Visit: Payer: Medicare Other

## 2022-03-11 ENCOUNTER — Other Ambulatory Visit: Payer: Self-pay

## 2022-03-11 VITALS — BP 128/72 | HR 70 | Resp 18 | Ht 61.0 in | Wt 153.6 lb

## 2022-03-11 DIAGNOSIS — I10 Essential (primary) hypertension: Secondary | ICD-10-CM

## 2022-03-11 MED ORDER — LOSARTAN POTASSIUM 25 MG PO TABS: 25.0000 mg | ORAL_TABLET | Freq: Every day | ORAL | 3 refills | Status: DC

## 2022-05-13 ENCOUNTER — Encounter: Payer: Self-pay | Admitting: Cardiology

## 2022-05-13 ENCOUNTER — Ambulatory Visit (INDEPENDENT_AMBULATORY_CARE_PROVIDER_SITE_OTHER): Payer: Medicare Other | Admitting: Cardiology

## 2022-05-13 VITALS — BP 134/70 | HR 80 | Ht 61.0 in | Wt 157.6 lb

## 2022-05-13 DIAGNOSIS — I10 Essential (primary) hypertension: Secondary | ICD-10-CM | POA: Diagnosis not present

## 2022-05-13 DIAGNOSIS — I480 Paroxysmal atrial fibrillation: Secondary | ICD-10-CM

## 2022-05-13 DIAGNOSIS — E782 Mixed hyperlipidemia: Secondary | ICD-10-CM

## 2022-05-13 DIAGNOSIS — I350 Nonrheumatic aortic (valve) stenosis: Secondary | ICD-10-CM | POA: Diagnosis not present

## 2022-05-13 DIAGNOSIS — I340 Nonrheumatic mitral (valve) insufficiency: Secondary | ICD-10-CM

## 2022-05-13 NOTE — Progress Notes (Signed)
Cardiology Office Note:    Date:  05/13/2022   ID:  ALIZANDRA LOH, DOB 19-Feb-1930, MRN [id number]  PCP:  Cyndi Bender, PA-C  Cardiologist:  Jenean Lindau, MD   Referring MD: Cyndi Bender, PA-C    ASSESSMENT:    1. Moderate mitral insufficiency   2. Aortic valve stenosis, etiology of cardiac valve disease unspecified   3. Essential hypertension   4. Mixed hyperlipidemia   5. PAF (paroxysmal atrial fibrillation) ([facility])    PLAN:    In order of problems listed above:  Primary prevention stressed with the patient.  Importance of compliance with diet medication stressed she vocalized understanding. Essential hypertension: Stable and diet was emphasized. Moderate aortic stenosis: Medical management.  Asymptomatic at this time.  She denies any history of chest pain syncope or dyspnea on exertion. Paroxysmal atrial fibrillation:I discussed with the patient atrial fibrillation, disease process. Management and therapy including rate and rhythm control, anticoagulation benefits and potential risks were discussed extensively with the patient. Patient had multiple questions which were answered to patient's satisfaction.  She mentions to me that her primary care cut down digoxin dose to every other day and she is happy about it. Mixed dyslipidemia: On statin therapy and followed by primary care. Moderate mitral regurgitation: Stable and medical management. Patient will be seen in follow-up appointment in 6 months or earlier if the patient has any concerns    Medication Adjustments/Labs and Tests Ordered: Current medicines are reviewed at length with the patient today.  Concerns regarding medicines are outlined above.  No orders of the defined types were placed in this encounter.  No orders of the defined types were placed in this encounter.    No chief complaint on file.    History of Present Illness:    IOANA LOUKS is a 88 y.o. female.  Patient past medical history of  moderate mitral regurgitation and aortic stenosis.  She has paroxysmal atrial fibrillation.  She is an ex-smoker.  She denies any problems at this time and takes care of activities of daily living.  No chest pain orthopnea or PND.  At the time of my evaluation, the patient is alert awake oriented and in no distress.  Past Medical History:  Diagnosis Date   Aortic stenosis 02/24/2022   Bilateral leg edema    Bilateral leg edema    Calculus of gallbladder with chronic cholecystitis without obstruction 10/12/2016   Chronic kidney disease 07/09/2015   Cigarette smoker 07/09/2015   CKD (chronic kidney disease)    COPD (chronic obstructive pulmonary disease) ([place])    Cystocele 1/91/4782   Diastolic dysfunction    DOE (dyspnea on exertion) 11/01/2019   Eczema    Essential hypertension 07/09/2015   Gout    Hypercalcemia    Hyperlipidemia    Lumbar pain with radiation down right leg    Lumbar pain with radiation down right leg    Moderate mitral insufficiency 07/09/2015   Osteoarthritis of left knee    Osteopenia    PAF (paroxysmal atrial fibrillation) ([place]) 01/22/2020   Pain in both upper extremities    Pedal edema 11/01/2019   Peptic ulcer disease    Personal history of transient ischemic attack (TIA), and cerebral infarction without residual deficits 07/09/2015   Postoperative examination 02/12/2016   Pure hypercholesterolemia 07/09/2015   Stress incontinence in female 02/12/2016   Vitamin D deficiency     Past Surgical History:  Procedure Laterality Date   ABDOMINAL HYSTERECTOMY     APPENDECTOMY  KIDNEY SURGERY     OVARIAN CYST SURGERY     TUBAL LIGATION      Current Medications: Current Meds  Medication Sig   acetaminophen (TYLENOL) 650 MG CR tablet Take 650 mg by mouth every 8 (eight) hours as needed for pain.   albuterol (VENTOLIN HFA) 108 (90 Base) MCG/ACT inhaler Inhale 1-2 puffs into the lungs every 4 (four) hours as needed for shortness of breath.   allopurinol (ZYLOPRIM)  100 MG tablet Take 200 mg by mouth daily.   carvedilol (COREG) 6.25 MG tablet Take 6.25 mg by mouth 2 (two) times daily.   Cholecalciferol (VITAMIN D3) 50 MCG (2000 UT) capsule Take 2,000 Units by mouth daily.   digoxin (LANOXIN) 0.125 MG tablet Take 0.125 mg by mouth 3 (three) times a week.   doxazosin (CARDURA) 8 MG tablet Take 8 mg by mouth at bedtime.   hydrocortisone 2.5 % cream Apply topically as needed for itching or rash.   losartan (COZAAR) 25 MG tablet Take 1 tablet (25 mg total) by mouth daily.   pantoprazole (PROTONIX) 40 MG tablet Take 40 mg by mouth daily.   pravastatin (PRAVACHOL) 40 MG tablet Take 40 mg by mouth at bedtime.   Prenatal Vit-Fe Fumarate-FA (PNV PRENATAL PLUS MULTIVITAMIN) 27-1 MG TABS Take 1 tablet by mouth daily.   rivaroxaban (XARELTO) 15 MG TABS tablet Take 1 tablet (15 mg total) by mouth daily with supper.   torsemide (DEMADEX) 10 MG tablet Take 10 mg by mouth every morning.   traZODone (DESYREL) 100 MG tablet Take 100 mg by mouth at bedtime.   TRELEGY ELLIPTA 100-62.5-25 MCG/ACT AEPB Take 1 puff by mouth daily.   vitamin B-12 (CYANOCOBALAMIN) 100 MCG tablet Take 100 mcg by mouth daily.     Allergies:   Amlodipine and Hydrocodone-acetaminophen   Social History   Socioeconomic History   Marital status: Single    Spouse name: Not on file   Number of children: Not on file   Years of education: Not on file   Highest education level: Not on file  Occupational History   Not on file  Tobacco Use   Smoking status: Former    Packs/day: 0.25    Types: Cigarettes    Quit date: 01/19/2020    Years since quitting: 2.3   Smokeless tobacco: Never  Substance and Sexual Activity   Alcohol use: Never   Drug use: Never   Sexual activity: Not on file  Other Topics Concern   Not on file  Social History Narrative   Not on file   Social Determinants of Health   Financial Resource Strain: Not on file  Food Insecurity: Not on file  Transportation Needs: Not on  file  Physical Activity: Not on file  Stress: Not on file  Social Connections: Not on file     Family History: The patient's family history includes Diabetes in her sister.  ROS:   Please see the history of present illness.    All other systems reviewed and are negative.  EKGs/Labs/Other Studies Reviewed:    The following studies were reviewed today: I discussed my findings with the patient at length.   Recent Labs: 02/24/2022: BUN 40; Creatinine, Ser 1.76; Potassium 5.1; Sodium 143  Recent Lipid Panel No results found for: CHOL, TRIG, HDL, CHOLHDL, VLDL, LDLCALC, LDLDIRECT  Physical Exam:    VS:  BP 134/70   Pulse 80   Ht '5\' 1"'$  (1.549 m)   Wt 157 lb 9.6 oz (71.5 kg)  SpO2 94%   BMI 29.78 kg/m     Wt Readings from Last 3 Encounters:  05/13/22 157 lb 9.6 oz (71.5 kg)  03/11/22 153 lb 9.6 oz (69.7 kg)  02/24/22 151 lb 9.6 oz (68.8 kg)     GEN: Patient is in no acute distress HEENT: Normal NECK: No JVD; No carotid bruits LYMPHATICS: No lymphadenopathy CARDIAC: Hear sounds regular, 2/6 systolic murmur at the apex. RESPIRATORY:  Clear to auscultation without rales, wheezing or rhonchi  ABDOMEN: Soft, non-tender, non-distended MUSCULOSKELETAL:  No edema; No deformity  SKIN: Warm and dry NEUROLOGIC:  Alert and oriented x 3 PSYCHIATRIC:  Normal affect   Signed, Jenean Lindau, MD  05/13/2022 2:33 PM    [place]

## 2022-05-13 NOTE — Addendum Note (Signed)
Addended by: Truddie Hidden on: 05/13/2022 02:40 PM   Modules accepted: Orders

## 2022-05-13 NOTE — Patient Instructions (Signed)
Medication Instructions:  ?Your physician recommends that you continue on your current medications as directed. Please refer to the Current Medication list given to you today. ? ?*If you need a refill on your cardiac medications before your next appointment, please call your pharmacy* ? ? ?Lab Work: ?None ordered ?If you have labs (blood work) drawn today and your tests are completely normal, you will receive your results only by: ?MyChart Message (if you have MyChart) OR ?A paper copy in the mail ?If you have any lab test that is abnormal or we need to change your treatment, we will call you to review the results. ? ? ?Testing/Procedures: ?Your physician has requested that you have an echocardiogram. Echocardiography is a painless test that uses sound waves to create images of your heart. It provides your doctor with information about the size and shape of your heart and how well your heart?s chambers and valves are working. This procedure takes approximately one hour. There are no restrictions for this procedure. ? ? ? ?Follow-Up: ?At [facility], you and your health needs are our priority.  As part of our continuing mission to provide you with exceptional heart care, we have created designated Provider Care Teams.  These Care Teams include your primary Cardiologist (physician) and Advanced Practice Providers (APPs -  Physician Assistants and Nurse Practitioners) who all work together to provide you with the care you need, when you need it. ? ?We recommend signing up for the patient portal called "MyChart".  Sign up information is provided on this After Visit Summary.  MyChart is used to connect with patients for Virtual Visits (Telemedicine).  Patients are able to view lab/test results, encounter notes, upcoming appointments, etc.  Non-urgent messages can be sent to your provider as well.   ?To learn more about what you can do with MyChart, go to [email or web address].   ? ?Your next appointment:   ?9  month(s) ? ?The format for your next appointment:   ?In Person ? ?Provider:   ?Rajan Revankar, MD ? ? ?Other Instructions ?Echocardiogram ?An echocardiogram is a test that uses sound waves (ultrasound) to produce images of the heart. ?Images from an echocardiogram can provide important information about: ?Heart size and shape. ?The size and thickness and movement of your heart's walls. ?Heart muscle function and strength. ?Heart valve function or if you have stenosis. Stenosis is when the heart valves are too narrow. ?If blood is flowing backward through the heart valves (regurgitation). ?A tumor or infectious growth around the heart valves. ?Areas of heart muscle that are not working well because of poor blood flow or injury from a heart attack. ?Aneurysm detection. An aneurysm is a weak or damaged part of an artery wall. The wall bulges out from the normal force of blood pumping through the body. ?Tell a health care provider about: ?Any allergies you have. ?All medicines you are taking, including vitamins, herbs, eye drops, creams, and over-the-counter medicines. ?Any blood disorders you have. ?Any surgeries you have had. ?Any medical conditions you have. ?Whether you are pregnant or may be pregnant. ?What are the risks? ?Generally, this is a safe test. However, problems may occur, including an allergic reaction to dye (contrast) that may be used during the test. ?What happens before the test? ?No specific preparation is needed. You may eat and drink normally. ?What happens during the test? ?You will take off your clothes from the waist up and put on a hospital gown. ?Electrodes or electrocardiogram (ECG)patches may be placed on   your chest. The electrodes or patches are then connected to a device that monitors your heart rate and rhythm. ?You will lie down on a table for an ultrasound exam. A gel will be applied to your chest to help sound waves pass through your skin. ?A handheld device, called a transducer, will  be pressed against your chest and moved over your heart. The transducer produces sound waves that travel to your heart and bounce back (or "echo" back) to the transducer. These sound waves will be captured in real-time and changed into images of your heart that can be viewed on a video monitor. The images will be recorded on a computer and reviewed by your health care provider. ?You may be asked to change positions or hold your breath for a short time. This makes it easier to get different views or better views of your heart. ?In some cases, you may receive contrast through an IV in one of your veins. This can improve the quality of the pictures from your heart. ?The procedure may vary among health care providers and hospitals.   ?What can I expect after the test? ?You may return to your normal, everyday life, including diet, activities, and medicines, unless your health care provider tells you not to do that. ?Follow these instructions at home: ?It is up to you to get the results of your test. Ask your health care provider, or the department that is doing the test, when your results will be ready. ?Keep all follow-up visits. This is important. ?Summary ?An echocardiogram is a test that uses sound waves (ultrasound) to produce images of the heart. ?Images from an echocardiogram can provide important information about the size and shape of your heart, heart muscle function, heart valve function, and other possible heart problems. ?You do not need to do anything to prepare before this test. You may eat and drink normally. ?After the echocardiogram is completed, you may return to your normal, everyday life, unless your health care provider tells you not to do that. ?This information is not intended to replace advice given to you by your health care provider. Make sure you discuss any questions you have with your health care provider. ?Document Revised: 08/04/2020 Document Reviewed: 08/04/2020 ?Elsevier Patient  Education ? 2021 [employer] ? ? ?

## 2022-05-20 ENCOUNTER — Ambulatory Visit (INDEPENDENT_AMBULATORY_CARE_PROVIDER_SITE_OTHER): Payer: Medicare Other

## 2022-05-20 DIAGNOSIS — I340 Nonrheumatic mitral (valve) insufficiency: Secondary | ICD-10-CM

## 2022-05-20 DIAGNOSIS — I350 Nonrheumatic aortic (valve) stenosis: Secondary | ICD-10-CM | POA: Diagnosis not present

## 2022-05-20 DIAGNOSIS — I480 Paroxysmal atrial fibrillation: Secondary | ICD-10-CM

## 2022-05-20 LAB — ECHOCARDIOGRAM COMPLETE
AV Mean grad: 7.2 mmHg
AV Peak grad: 12.9 mmHg
Ao pk vel: 1.8 m/s
Area-P 1/2: 4.6 cm2
MV M vel: 6.09 m/s
MV Peak grad: 148.4 mmHg
Radius: 0.6 cm
S' Lateral: 2.9 cm

## 2022-05-26 ENCOUNTER — Telehealth: Payer: Self-pay | Admitting: Cardiology

## 2022-05-26 NOTE — Telephone Encounter (Signed)
Patient was returning call, please advise

## 2022-06-28 ENCOUNTER — Other Ambulatory Visit: Payer: Self-pay | Admitting: Cardiology

## 2022-07-19 ENCOUNTER — Other Ambulatory Visit: Payer: Self-pay | Admitting: Cardiology

## 2022-07-19 NOTE — Telephone Encounter (Signed)
Prescription refill request for Xarelto received.  Indication:Afib Last office visit:5/23 Weight:71.5 kg Age:88 Scr:1.7 CrCl:25.32 ml/min  Prescription refilled

## 2022-12-11 ENCOUNTER — Other Ambulatory Visit: Payer: Self-pay | Admitting: Cardiology

## 2022-12-11 DIAGNOSIS — I480 Paroxysmal atrial fibrillation: Secondary | ICD-10-CM

## 2022-12-12 NOTE — Telephone Encounter (Signed)
Prescription refill request for Xarelto received.  Indication: Afib  Last office visit: 05/13/22 (Revankar)  Weight: 71.5kg Age: 88 Scr: 1.76 (02/24/22)  CrCl: 23.28m/min  Appropriate dose and refill sent to requested pharmacy.

## 2022-12-20 ENCOUNTER — Telehealth: Payer: Self-pay | Admitting: Cardiology

## 2022-12-20 NOTE — Telephone Encounter (Signed)
Patient called and wanted to know about Losartan medication because she has a '25mg'$  and a '100mg'$  and she doesn't know which one to take. Please call back.

## 2022-12-20 NOTE — Telephone Encounter (Signed)
Advised that she is taking 25 mg Losartan. Pt verbalized understanding and had no additional questions.

## 2023-01-05 DIAGNOSIS — E782 Mixed hyperlipidemia: Secondary | ICD-10-CM | POA: Diagnosis not present

## 2023-01-05 DIAGNOSIS — M109 Gout, unspecified: Secondary | ICD-10-CM | POA: Diagnosis not present

## 2023-01-05 DIAGNOSIS — M25531 Pain in right wrist: Secondary | ICD-10-CM | POA: Diagnosis not present

## 2023-01-05 DIAGNOSIS — N183 Chronic kidney disease, stage 3 unspecified: Secondary | ICD-10-CM | POA: Diagnosis not present

## 2023-01-05 DIAGNOSIS — I4891 Unspecified atrial fibrillation: Secondary | ICD-10-CM | POA: Diagnosis not present

## 2023-01-05 DIAGNOSIS — E039 Hypothyroidism, unspecified: Secondary | ICD-10-CM | POA: Diagnosis not present

## 2023-01-05 DIAGNOSIS — K279 Peptic ulcer, site unspecified, unspecified as acute or chronic, without hemorrhage or perforation: Secondary | ICD-10-CM | POA: Diagnosis not present

## 2023-01-05 DIAGNOSIS — R600 Localized edema: Secondary | ICD-10-CM | POA: Diagnosis not present

## 2023-01-05 DIAGNOSIS — Z139 Encounter for screening, unspecified: Secondary | ICD-10-CM | POA: Diagnosis not present

## 2023-01-05 DIAGNOSIS — J449 Chronic obstructive pulmonary disease, unspecified: Secondary | ICD-10-CM | POA: Diagnosis not present

## 2023-01-05 DIAGNOSIS — J9611 Chronic respiratory failure with hypoxia: Secondary | ICD-10-CM | POA: Diagnosis not present

## 2023-01-05 DIAGNOSIS — I10 Essential (primary) hypertension: Secondary | ICD-10-CM | POA: Diagnosis not present

## 2023-01-05 DIAGNOSIS — Z23 Encounter for immunization: Secondary | ICD-10-CM | POA: Diagnosis not present

## 2023-01-05 DIAGNOSIS — Z79899 Other long term (current) drug therapy: Secondary | ICD-10-CM | POA: Diagnosis not present

## 2023-01-17 DIAGNOSIS — R0902 Hypoxemia: Secondary | ICD-10-CM | POA: Diagnosis not present

## 2023-01-19 DIAGNOSIS — M1811 Unilateral primary osteoarthritis of first carpometacarpal joint, right hand: Secondary | ICD-10-CM | POA: Diagnosis not present

## 2023-01-19 DIAGNOSIS — M1810 Unilateral primary osteoarthritis of first carpometacarpal joint, unspecified hand: Secondary | ICD-10-CM | POA: Diagnosis not present

## 2023-01-26 DIAGNOSIS — J449 Chronic obstructive pulmonary disease, unspecified: Secondary | ICD-10-CM | POA: Diagnosis not present

## 2023-02-17 DIAGNOSIS — R0902 Hypoxemia: Secondary | ICD-10-CM | POA: Diagnosis not present

## 2023-02-24 ENCOUNTER — Other Ambulatory Visit: Payer: Self-pay

## 2023-02-28 ENCOUNTER — Ambulatory Visit: Payer: 59 | Attending: Cardiology | Admitting: Cardiology

## 2023-02-28 ENCOUNTER — Encounter: Payer: Self-pay | Admitting: Cardiology

## 2023-02-28 VITALS — BP 156/84 | HR 77 | Ht 61.0 in | Wt 162.4 lb

## 2023-02-28 DIAGNOSIS — I340 Nonrheumatic mitral (valve) insufficiency: Secondary | ICD-10-CM

## 2023-02-28 DIAGNOSIS — I480 Paroxysmal atrial fibrillation: Secondary | ICD-10-CM

## 2023-02-28 DIAGNOSIS — I10 Essential (primary) hypertension: Secondary | ICD-10-CM

## 2023-02-28 DIAGNOSIS — I050 Rheumatic mitral stenosis: Secondary | ICD-10-CM

## 2023-02-28 DIAGNOSIS — I350 Nonrheumatic aortic (valve) stenosis: Secondary | ICD-10-CM

## 2023-02-28 HISTORY — DX: Nonrheumatic aortic (valve) stenosis: I35.0

## 2023-02-28 HISTORY — DX: Rheumatic mitral stenosis: I05.0

## 2023-02-28 NOTE — Patient Instructions (Signed)

## 2023-02-28 NOTE — Progress Notes (Signed)
Cardiology Office Note:    Date:  02/28/2023   ID:  Tiffany Mitchell, DOB January 16, 1930, MRN [id number]  PCP:  Cyndi Bender, PA-C  Cardiologist:  Jenean Lindau, MD   Referring MD: Cyndi Bender, PA-C    ASSESSMENT:    1. Essential hypertension   2. PAF (paroxysmal atrial fibrillation) ([place])   3. Moderate mitral insufficiency   4. Mild mitral stenosis   5. Mild aortic stenosis    PLAN:    In order of problems listed above:  Primary prevention stressed with the patient.  Importance of compliance with diet medication stressed and she vocalized understanding. Atrial fibrillation:I discussed with the patient atrial fibrillation, disease process. Management and therapy including rate and rhythm control, anticoagulation benefits and potential risks were discussed extensively with the patient. Patient had multiple questions which were answered to patient's satisfaction. Mild aortic stenosis: Stable and asymptomatic.  We will continue to monitor. Mild mitral stenosis and moderate mitral insufficiency: Asymptomatic and stable.  Medical management at this time. Essential hypertension: Blood pressure stable.  She has an element of whitecoat hypertension.  I discussed numbers with her and they are fine. She was counseled against smoking and she understands this. Patient will be seen in follow-up appointment in 6 months or earlier if the patient has any concerns    Medication Adjustments/Labs and Tests Ordered: Current medicines are reviewed at length with the patient today.  Concerns regarding medicines are outlined above.  Orders Placed This Encounter  Procedures   EKG 12-Lead   No orders of the defined types were placed in this encounter.    No chief complaint on file.    History of Present Illness:    Tiffany Mitchell is a 89 y.o. female.  Patient has past medical history of mild mitral stenosis, moderate mitral regurgitation, mild aortic stenosis.  She has atrial  fibrillation.  She denies any problems at this time and takes care of activities of daily living.  No chest pain orthopnea or PND.  She mentions to me that her blood pressure is stable at home.  She checks it occasionally.  She mentioned to me the numbers.  Past Medical History:  Diagnosis Date   Aortic stenosis 02/24/2022   Bilateral leg edema    Bilateral leg edema    Calculus of gallbladder with chronic cholecystitis without obstruction 10/12/2016   Chronic kidney disease 07/09/2015   Cigarette smoker 07/09/2015   CKD (chronic kidney disease)    CKD (chronic kidney disease)    COPD (chronic obstructive pulmonary disease) ([place])    Cystocele [id number]   Diastolic dysfunction    DOE (dyspnea on exertion) 11/01/2019   Eczema    Essential hypertension 07/09/2015   Gout    Hypercalcemia    Hyperlipidemia    Lumbar pain with radiation down right leg    Lumbar pain with radiation down right leg    Moderate mitral insufficiency 07/09/2015   Osteoarthritis of left knee    Osteopenia    PAF (paroxysmal atrial fibrillation) ([place]) 01/22/2020   Pain in both upper extremities    Pedal edema 11/01/2019   Peptic ulcer disease    Personal history of transient ischemic attack (TIA), and cerebral infarction without residual deficits 07/09/2015   Postoperative examination 02/12/2016   Pure hypercholesterolemia 07/09/2015   Stress incontinence in female 02/12/2016   Vitamin D deficiency     Past Surgical History:  Procedure Laterality Date   ABDOMINAL HYSTERECTOMY     APPENDECTOMY  KIDNEY SURGERY     OVARIAN CYST SURGERY     TUBAL LIGATION      Current Medications: Current Meds  Medication Sig   acetaminophen (TYLENOL) 650 MG CR tablet Take 650 mg by mouth every 8 (eight) hours as needed for pain.   albuterol (VENTOLIN HFA) 108 (90 Base) MCG/ACT inhaler Inhale 1-2 puffs into the lungs every 4 (four) hours as needed for shortness of breath.   allopurinol (ZYLOPRIM) 100 MG tablet  Take 200 mg by mouth daily.   carvedilol (COREG) 6.25 MG tablet Take 6.25 mg by mouth 2 (two) times daily.   Cholecalciferol (VITAMIN D3) 50 MCG (2000 UT) capsule Take 2,000 Units by mouth daily.   digoxin (LANOXIN) 0.125 MG tablet Take 0.125 mg by mouth 3 (three) times a week.   doxazosin (CARDURA) 8 MG tablet Take 8 mg by mouth at bedtime.   hydrocortisone 2.5 % cream Apply 1 Application topically as needed for itching or rash.   losartan (COZAAR) 100 MG tablet Take 100 mg by mouth daily.   pantoprazole (PROTONIX) 40 MG tablet Take 40 mg by mouth daily.   pravastatin (PRAVACHOL) 40 MG tablet Take 40 mg by mouth at bedtime.   Prenatal Vit-Fe Fumarate-FA (PNV PRENATAL PLUS MULTIVITAMIN) 27-1 MG TABS Take 1 tablet by mouth daily.   torsemide (DEMADEX) 10 MG tablet Take 10 mg by mouth every morning.   traZODone (DESYREL) 100 MG tablet Take 100 mg by mouth at bedtime.   TRELEGY ELLIPTA 100-62.5-25 MCG/ACT AEPB Take 1 puff by mouth daily.   vitamin B-12 (CYANOCOBALAMIN) 100 MCG tablet Take 100 mcg by mouth daily.   XARELTO 15 MG TABS tablet TAKE 1 TABLET(15 MG) BY MOUTH DAILY WITH SUPPER     Allergies:   Amlodipine and Hydrocodone-acetaminophen   Social History   Socioeconomic History   Marital status: Single    Spouse name: Not on file   Number of children: Not on file   Years of education: Not on file   Highest education level: Not on file  Occupational History   Not on file  Tobacco Use   Smoking status: Former    Packs/day: 0.25    Types: Cigarettes    Quit date: 01/19/2020    Years since quitting: 3.1   Smokeless tobacco: Never  Substance and Sexual Activity   Alcohol use: Never   Drug use: Never   Sexual activity: Not on file  Other Topics Concern   Not on file  Social History Narrative   Not on file   Social Determinants of Health   Financial Resource Strain: Not on file  Food Insecurity: Not on file  Transportation Needs: Not on file  Physical Activity: Not on  file  Stress: Not on file  Social Connections: Not on file     Family History: The patient's family history includes Diabetes in her sister.  ROS:   Please see the history of present illness.    All other systems reviewed and are negative.  EKGs/Labs/Other Studies Reviewed:    The following studies were reviewed today: EKG reveals atrial fibrillation with well-controlled ventricular rate and anterior infarction of undetermined age.   Recent Labs: No results found for requested labs within last 365 days.  Recent Lipid Panel No results found for: "CHOL", "TRIG", "HDL", "CHOLHDL", "VLDL", "LDLCALC", "LDLDIRECT"  Physical Exam:    VS:  BP (!) 156/84   Pulse 77   Ht '5\' 1"'$  (1.549 m)   Wt 162 lb 6.4 oz (  73.7 kg)   SpO2 94%   BMI 30.69 kg/m     Wt Readings from Last 3 Encounters:  02/28/23 162 lb 6.4 oz (73.7 kg)  05/13/22 157 lb 9.6 oz (71.5 kg)  03/11/22 153 lb 9.6 oz (69.7 kg)     GEN: Patient is in no acute distress HEENT: Normal NECK: No JVD; No carotid bruits LYMPHATICS: No lymphadenopathy CARDIAC: Hear sounds regular, 2/6 systolic murmur at the apex. RESPIRATORY:  Clear to auscultation without rales, wheezing or rhonchi  ABDOMEN: Soft, non-tender, non-distended MUSCULOSKELETAL:  No edema; No deformity  SKIN: Warm and dry NEUROLOGIC:  Alert and oriented x 3 PSYCHIATRIC:  Normal affect   Signed, Jenean Lindau, MD  02/28/2023 11:38 AM    [place]

## 2023-03-18 DIAGNOSIS — R0902 Hypoxemia: Secondary | ICD-10-CM | POA: Diagnosis not present

## 2023-04-18 DIAGNOSIS — R0902 Hypoxemia: Secondary | ICD-10-CM | POA: Diagnosis not present

## 2023-05-04 DIAGNOSIS — J449 Chronic obstructive pulmonary disease, unspecified: Secondary | ICD-10-CM | POA: Diagnosis not present

## 2023-05-18 DIAGNOSIS — R0902 Hypoxemia: Secondary | ICD-10-CM | POA: Diagnosis not present

## 2023-06-18 DIAGNOSIS — R0902 Hypoxemia: Secondary | ICD-10-CM | POA: Diagnosis not present

## 2023-07-06 DIAGNOSIS — E782 Mixed hyperlipidemia: Secondary | ICD-10-CM | POA: Diagnosis not present

## 2023-07-06 DIAGNOSIS — I10 Essential (primary) hypertension: Secondary | ICD-10-CM | POA: Diagnosis not present

## 2023-07-06 DIAGNOSIS — E039 Hypothyroidism, unspecified: Secondary | ICD-10-CM | POA: Diagnosis not present

## 2023-07-06 DIAGNOSIS — M109 Gout, unspecified: Secondary | ICD-10-CM | POA: Diagnosis not present

## 2023-07-06 DIAGNOSIS — Z79899 Other long term (current) drug therapy: Secondary | ICD-10-CM | POA: Diagnosis not present

## 2023-07-06 DIAGNOSIS — G4700 Insomnia, unspecified: Secondary | ICD-10-CM | POA: Diagnosis not present

## 2023-07-06 DIAGNOSIS — N183 Chronic kidney disease, stage 3 unspecified: Secondary | ICD-10-CM | POA: Diagnosis not present

## 2023-07-06 DIAGNOSIS — J449 Chronic obstructive pulmonary disease, unspecified: Secondary | ICD-10-CM | POA: Diagnosis not present

## 2023-07-06 DIAGNOSIS — Z9181 History of falling: Secondary | ICD-10-CM | POA: Diagnosis not present

## 2023-07-06 DIAGNOSIS — K279 Peptic ulcer, site unspecified, unspecified as acute or chronic, without hemorrhage or perforation: Secondary | ICD-10-CM | POA: Diagnosis not present

## 2023-07-06 DIAGNOSIS — J9611 Chronic respiratory failure with hypoxia: Secondary | ICD-10-CM | POA: Diagnosis not present

## 2023-07-06 DIAGNOSIS — I4891 Unspecified atrial fibrillation: Secondary | ICD-10-CM | POA: Diagnosis not present

## 2023-07-11 ENCOUNTER — Encounter: Payer: Self-pay | Admitting: Cardiology

## 2023-07-11 NOTE — Telephone Encounter (Signed)
Error

## 2023-07-17 ENCOUNTER — Other Ambulatory Visit: Payer: Self-pay | Admitting: Cardiology

## 2023-07-18 DIAGNOSIS — R0902 Hypoxemia: Secondary | ICD-10-CM | POA: Diagnosis not present

## 2023-08-14 DIAGNOSIS — C44319 Basal cell carcinoma of skin of other parts of face: Secondary | ICD-10-CM | POA: Diagnosis not present

## 2023-08-14 DIAGNOSIS — L821 Other seborrheic keratosis: Secondary | ICD-10-CM | POA: Diagnosis not present

## 2023-08-14 DIAGNOSIS — L578 Other skin changes due to chronic exposure to nonionizing radiation: Secondary | ICD-10-CM | POA: Diagnosis not present

## 2023-08-18 DIAGNOSIS — R0902 Hypoxemia: Secondary | ICD-10-CM | POA: Diagnosis not present

## 2023-09-18 DIAGNOSIS — R0902 Hypoxemia: Secondary | ICD-10-CM | POA: Diagnosis not present

## 2023-09-20 DIAGNOSIS — C44329 Squamous cell carcinoma of skin of other parts of face: Secondary | ICD-10-CM | POA: Diagnosis not present

## 2023-09-28 DIAGNOSIS — J449 Chronic obstructive pulmonary disease, unspecified: Secondary | ICD-10-CM | POA: Diagnosis not present

## 2023-09-28 DIAGNOSIS — R918 Other nonspecific abnormal finding of lung field: Secondary | ICD-10-CM | POA: Diagnosis not present

## 2023-10-18 DIAGNOSIS — R0902 Hypoxemia: Secondary | ICD-10-CM | POA: Diagnosis not present

## 2023-11-18 DIAGNOSIS — R0902 Hypoxemia: Secondary | ICD-10-CM | POA: Diagnosis not present

## 2023-12-12 DIAGNOSIS — I509 Heart failure, unspecified: Secondary | ICD-10-CM | POA: Diagnosis not present

## 2023-12-12 DIAGNOSIS — M542 Cervicalgia: Secondary | ICD-10-CM | POA: Diagnosis not present

## 2023-12-12 DIAGNOSIS — I252 Old myocardial infarction: Secondary | ICD-10-CM | POA: Diagnosis not present

## 2023-12-12 DIAGNOSIS — J9811 Atelectasis: Secondary | ICD-10-CM | POA: Diagnosis not present

## 2023-12-12 DIAGNOSIS — J449 Chronic obstructive pulmonary disease, unspecified: Secondary | ICD-10-CM | POA: Diagnosis not present

## 2023-12-12 DIAGNOSIS — I4891 Unspecified atrial fibrillation: Secondary | ICD-10-CM | POA: Diagnosis not present

## 2023-12-12 DIAGNOSIS — R519 Headache, unspecified: Secondary | ICD-10-CM | POA: Diagnosis not present

## 2023-12-12 DIAGNOSIS — R9431 Abnormal electrocardiogram [ECG] [EKG]: Secondary | ICD-10-CM | POA: Diagnosis not present

## 2023-12-13 DIAGNOSIS — J984 Other disorders of lung: Secondary | ICD-10-CM | POA: Diagnosis not present

## 2023-12-13 DIAGNOSIS — J449 Chronic obstructive pulmonary disease, unspecified: Secondary | ICD-10-CM | POA: Diagnosis not present

## 2023-12-13 DIAGNOSIS — M62838 Other muscle spasm: Secondary | ICD-10-CM | POA: Diagnosis not present

## 2023-12-13 DIAGNOSIS — Z792 Long term (current) use of antibiotics: Secondary | ICD-10-CM | POA: Diagnosis not present

## 2023-12-13 DIAGNOSIS — I371 Nonrheumatic pulmonary valve insufficiency: Secondary | ICD-10-CM

## 2023-12-13 DIAGNOSIS — I4891 Unspecified atrial fibrillation: Secondary | ICD-10-CM | POA: Diagnosis not present

## 2023-12-13 DIAGNOSIS — Z79899 Other long term (current) drug therapy: Secondary | ICD-10-CM | POA: Diagnosis not present

## 2023-12-13 DIAGNOSIS — I361 Nonrheumatic tricuspid (valve) insufficiency: Secondary | ICD-10-CM

## 2023-12-13 DIAGNOSIS — I5033 Acute on chronic diastolic (congestive) heart failure: Secondary | ICD-10-CM | POA: Diagnosis not present

## 2023-12-13 DIAGNOSIS — I350 Nonrheumatic aortic (valve) stenosis: Secondary | ICD-10-CM

## 2023-12-13 DIAGNOSIS — Z7901 Long term (current) use of anticoagulants: Secondary | ICD-10-CM | POA: Diagnosis not present

## 2023-12-13 DIAGNOSIS — I252 Old myocardial infarction: Secondary | ICD-10-CM | POA: Diagnosis not present

## 2023-12-13 DIAGNOSIS — J441 Chronic obstructive pulmonary disease with (acute) exacerbation: Secondary | ICD-10-CM | POA: Diagnosis not present

## 2023-12-13 DIAGNOSIS — S161XXA Strain of muscle, fascia and tendon at neck level, initial encounter: Secondary | ICD-10-CM | POA: Diagnosis not present

## 2023-12-13 DIAGNOSIS — M542 Cervicalgia: Secondary | ICD-10-CM | POA: Diagnosis not present

## 2023-12-13 DIAGNOSIS — K219 Gastro-esophageal reflux disease without esophagitis: Secondary | ICD-10-CM | POA: Diagnosis not present

## 2023-12-13 DIAGNOSIS — I160 Hypertensive urgency: Secondary | ICD-10-CM | POA: Diagnosis not present

## 2023-12-13 DIAGNOSIS — J440 Chronic obstructive pulmonary disease with acute lower respiratory infection: Secondary | ICD-10-CM | POA: Diagnosis not present

## 2023-12-13 DIAGNOSIS — R9431 Abnormal electrocardiogram [ECG] [EKG]: Secondary | ICD-10-CM | POA: Diagnosis not present

## 2023-12-13 DIAGNOSIS — I340 Nonrheumatic mitral (valve) insufficiency: Secondary | ICD-10-CM

## 2023-12-13 DIAGNOSIS — I110 Hypertensive heart disease with heart failure: Secondary | ICD-10-CM | POA: Diagnosis not present

## 2023-12-13 DIAGNOSIS — E785 Hyperlipidemia, unspecified: Secondary | ICD-10-CM | POA: Diagnosis not present

## 2023-12-13 DIAGNOSIS — I509 Heart failure, unspecified: Secondary | ICD-10-CM | POA: Diagnosis not present

## 2023-12-15 DIAGNOSIS — J984 Other disorders of lung: Secondary | ICD-10-CM | POA: Diagnosis not present

## 2023-12-18 DIAGNOSIS — I4891 Unspecified atrial fibrillation: Secondary | ICD-10-CM | POA: Diagnosis not present

## 2023-12-18 DIAGNOSIS — I130 Hypertensive heart and chronic kidney disease with heart failure and stage 1 through stage 4 chronic kidney disease, or unspecified chronic kidney disease: Secondary | ICD-10-CM | POA: Diagnosis not present

## 2023-12-18 DIAGNOSIS — I872 Venous insufficiency (chronic) (peripheral): Secondary | ICD-10-CM | POA: Diagnosis not present

## 2023-12-18 DIAGNOSIS — L89321 Pressure ulcer of left buttock, stage 1: Secondary | ICD-10-CM | POA: Diagnosis not present

## 2023-12-18 DIAGNOSIS — J441 Chronic obstructive pulmonary disease with (acute) exacerbation: Secondary | ICD-10-CM | POA: Diagnosis not present

## 2023-12-18 DIAGNOSIS — I160 Hypertensive urgency: Secondary | ICD-10-CM | POA: Diagnosis not present

## 2023-12-18 DIAGNOSIS — E559 Vitamin D deficiency, unspecified: Secondary | ICD-10-CM | POA: Diagnosis not present

## 2023-12-18 DIAGNOSIS — G2581 Restless legs syndrome: Secondary | ICD-10-CM | POA: Diagnosis not present

## 2023-12-18 DIAGNOSIS — L89311 Pressure ulcer of right buttock, stage 1: Secondary | ICD-10-CM | POA: Diagnosis not present

## 2023-12-18 DIAGNOSIS — G4700 Insomnia, unspecified: Secondary | ICD-10-CM | POA: Diagnosis not present

## 2023-12-18 DIAGNOSIS — J9611 Chronic respiratory failure with hypoxia: Secondary | ICD-10-CM | POA: Diagnosis not present

## 2023-12-18 DIAGNOSIS — E782 Mixed hyperlipidemia: Secondary | ICD-10-CM | POA: Diagnosis not present

## 2023-12-18 DIAGNOSIS — M47812 Spondylosis without myelopathy or radiculopathy, cervical region: Secondary | ICD-10-CM | POA: Diagnosis not present

## 2023-12-18 DIAGNOSIS — J439 Emphysema, unspecified: Secondary | ICD-10-CM | POA: Diagnosis not present

## 2023-12-18 DIAGNOSIS — M150 Primary generalized (osteo)arthritis: Secondary | ICD-10-CM | POA: Diagnosis not present

## 2023-12-18 DIAGNOSIS — D631 Anemia in chronic kidney disease: Secondary | ICD-10-CM | POA: Diagnosis not present

## 2023-12-18 DIAGNOSIS — M109 Gout, unspecified: Secondary | ICD-10-CM | POA: Diagnosis not present

## 2023-12-18 DIAGNOSIS — L89151 Pressure ulcer of sacral region, stage 1: Secondary | ICD-10-CM | POA: Diagnosis not present

## 2023-12-18 DIAGNOSIS — I088 Other rheumatic multiple valve diseases: Secondary | ICD-10-CM | POA: Diagnosis not present

## 2023-12-18 DIAGNOSIS — J440 Chronic obstructive pulmonary disease with acute lower respiratory infection: Secondary | ICD-10-CM | POA: Diagnosis not present

## 2023-12-18 DIAGNOSIS — E038 Other specified hypothyroidism: Secondary | ICD-10-CM | POA: Diagnosis not present

## 2023-12-18 DIAGNOSIS — M8580 Other specified disorders of bone density and structure, unspecified site: Secondary | ICD-10-CM | POA: Diagnosis not present

## 2023-12-18 DIAGNOSIS — N183 Chronic kidney disease, stage 3 unspecified: Secondary | ICD-10-CM | POA: Diagnosis not present

## 2023-12-18 DIAGNOSIS — I5031 Acute diastolic (congestive) heart failure: Secondary | ICD-10-CM | POA: Diagnosis not present

## 2023-12-18 DIAGNOSIS — R0902 Hypoxemia: Secondary | ICD-10-CM | POA: Diagnosis not present

## 2023-12-22 DIAGNOSIS — L89151 Pressure ulcer of sacral region, stage 1: Secondary | ICD-10-CM | POA: Diagnosis not present

## 2023-12-22 DIAGNOSIS — E038 Other specified hypothyroidism: Secondary | ICD-10-CM | POA: Diagnosis not present

## 2023-12-22 DIAGNOSIS — J9611 Chronic respiratory failure with hypoxia: Secondary | ICD-10-CM | POA: Diagnosis not present

## 2023-12-22 DIAGNOSIS — M109 Gout, unspecified: Secondary | ICD-10-CM | POA: Diagnosis not present

## 2023-12-22 DIAGNOSIS — M150 Primary generalized (osteo)arthritis: Secondary | ICD-10-CM | POA: Diagnosis not present

## 2023-12-22 DIAGNOSIS — I130 Hypertensive heart and chronic kidney disease with heart failure and stage 1 through stage 4 chronic kidney disease, or unspecified chronic kidney disease: Secondary | ICD-10-CM | POA: Diagnosis not present

## 2023-12-22 DIAGNOSIS — I5031 Acute diastolic (congestive) heart failure: Secondary | ICD-10-CM | POA: Diagnosis not present

## 2023-12-22 DIAGNOSIS — N183 Chronic kidney disease, stage 3 unspecified: Secondary | ICD-10-CM | POA: Diagnosis not present

## 2023-12-22 DIAGNOSIS — G2581 Restless legs syndrome: Secondary | ICD-10-CM | POA: Diagnosis not present

## 2023-12-22 DIAGNOSIS — I160 Hypertensive urgency: Secondary | ICD-10-CM | POA: Diagnosis not present

## 2023-12-22 DIAGNOSIS — I4891 Unspecified atrial fibrillation: Secondary | ICD-10-CM | POA: Diagnosis not present

## 2023-12-22 DIAGNOSIS — I088 Other rheumatic multiple valve diseases: Secondary | ICD-10-CM | POA: Diagnosis not present

## 2023-12-22 DIAGNOSIS — J440 Chronic obstructive pulmonary disease with acute lower respiratory infection: Secondary | ICD-10-CM | POA: Diagnosis not present

## 2023-12-22 DIAGNOSIS — D631 Anemia in chronic kidney disease: Secondary | ICD-10-CM | POA: Diagnosis not present

## 2023-12-22 DIAGNOSIS — L89321 Pressure ulcer of left buttock, stage 1: Secondary | ICD-10-CM | POA: Diagnosis not present

## 2023-12-22 DIAGNOSIS — J441 Chronic obstructive pulmonary disease with (acute) exacerbation: Secondary | ICD-10-CM | POA: Diagnosis not present

## 2023-12-22 DIAGNOSIS — M8580 Other specified disorders of bone density and structure, unspecified site: Secondary | ICD-10-CM | POA: Diagnosis not present

## 2023-12-22 DIAGNOSIS — E782 Mixed hyperlipidemia: Secondary | ICD-10-CM | POA: Diagnosis not present

## 2023-12-22 DIAGNOSIS — M47812 Spondylosis without myelopathy or radiculopathy, cervical region: Secondary | ICD-10-CM | POA: Diagnosis not present

## 2023-12-22 DIAGNOSIS — E559 Vitamin D deficiency, unspecified: Secondary | ICD-10-CM | POA: Diagnosis not present

## 2023-12-22 DIAGNOSIS — G4700 Insomnia, unspecified: Secondary | ICD-10-CM | POA: Diagnosis not present

## 2023-12-22 DIAGNOSIS — I872 Venous insufficiency (chronic) (peripheral): Secondary | ICD-10-CM | POA: Diagnosis not present

## 2023-12-22 DIAGNOSIS — L89311 Pressure ulcer of right buttock, stage 1: Secondary | ICD-10-CM | POA: Diagnosis not present

## 2023-12-22 DIAGNOSIS — J439 Emphysema, unspecified: Secondary | ICD-10-CM | POA: Diagnosis not present

## 2023-12-26 DIAGNOSIS — G4700 Insomnia, unspecified: Secondary | ICD-10-CM | POA: Diagnosis not present

## 2023-12-26 DIAGNOSIS — J440 Chronic obstructive pulmonary disease with acute lower respiratory infection: Secondary | ICD-10-CM | POA: Diagnosis not present

## 2023-12-26 DIAGNOSIS — I5031 Acute diastolic (congestive) heart failure: Secondary | ICD-10-CM | POA: Diagnosis not present

## 2023-12-26 DIAGNOSIS — E782 Mixed hyperlipidemia: Secondary | ICD-10-CM | POA: Diagnosis not present

## 2023-12-26 DIAGNOSIS — D631 Anemia in chronic kidney disease: Secondary | ICD-10-CM | POA: Diagnosis not present

## 2023-12-26 DIAGNOSIS — J9611 Chronic respiratory failure with hypoxia: Secondary | ICD-10-CM | POA: Diagnosis not present

## 2023-12-26 DIAGNOSIS — M150 Primary generalized (osteo)arthritis: Secondary | ICD-10-CM | POA: Diagnosis not present

## 2023-12-26 DIAGNOSIS — J439 Emphysema, unspecified: Secondary | ICD-10-CM | POA: Diagnosis not present

## 2023-12-26 DIAGNOSIS — I872 Venous insufficiency (chronic) (peripheral): Secondary | ICD-10-CM | POA: Diagnosis not present

## 2023-12-26 DIAGNOSIS — M109 Gout, unspecified: Secondary | ICD-10-CM | POA: Diagnosis not present

## 2023-12-26 DIAGNOSIS — E038 Other specified hypothyroidism: Secondary | ICD-10-CM | POA: Diagnosis not present

## 2023-12-26 DIAGNOSIS — N183 Chronic kidney disease, stage 3 unspecified: Secondary | ICD-10-CM | POA: Diagnosis not present

## 2023-12-26 DIAGNOSIS — L89321 Pressure ulcer of left buttock, stage 1: Secondary | ICD-10-CM | POA: Diagnosis not present

## 2023-12-26 DIAGNOSIS — M47812 Spondylosis without myelopathy or radiculopathy, cervical region: Secondary | ICD-10-CM | POA: Diagnosis not present

## 2023-12-26 DIAGNOSIS — G2581 Restless legs syndrome: Secondary | ICD-10-CM | POA: Diagnosis not present

## 2023-12-26 DIAGNOSIS — L89151 Pressure ulcer of sacral region, stage 1: Secondary | ICD-10-CM | POA: Diagnosis not present

## 2023-12-26 DIAGNOSIS — I4891 Unspecified atrial fibrillation: Secondary | ICD-10-CM | POA: Diagnosis not present

## 2023-12-26 DIAGNOSIS — E559 Vitamin D deficiency, unspecified: Secondary | ICD-10-CM | POA: Diagnosis not present

## 2023-12-26 DIAGNOSIS — I130 Hypertensive heart and chronic kidney disease with heart failure and stage 1 through stage 4 chronic kidney disease, or unspecified chronic kidney disease: Secondary | ICD-10-CM | POA: Diagnosis not present

## 2023-12-26 DIAGNOSIS — M8580 Other specified disorders of bone density and structure, unspecified site: Secondary | ICD-10-CM | POA: Diagnosis not present

## 2023-12-26 DIAGNOSIS — L89311 Pressure ulcer of right buttock, stage 1: Secondary | ICD-10-CM | POA: Diagnosis not present

## 2023-12-26 DIAGNOSIS — I088 Other rheumatic multiple valve diseases: Secondary | ICD-10-CM | POA: Diagnosis not present

## 2023-12-26 DIAGNOSIS — J441 Chronic obstructive pulmonary disease with (acute) exacerbation: Secondary | ICD-10-CM | POA: Diagnosis not present

## 2023-12-26 DIAGNOSIS — I160 Hypertensive urgency: Secondary | ICD-10-CM | POA: Diagnosis not present

## 2023-12-28 ENCOUNTER — Ambulatory Visit: Payer: Medicare Other | Attending: Cardiology | Admitting: Cardiology

## 2023-12-28 ENCOUNTER — Encounter: Payer: Self-pay | Admitting: Cardiology

## 2023-12-28 VITALS — BP 138/82 | HR 84 | Ht 61.6 in | Wt 155.6 lb

## 2023-12-28 DIAGNOSIS — I10 Essential (primary) hypertension: Secondary | ICD-10-CM

## 2023-12-28 DIAGNOSIS — I340 Nonrheumatic mitral (valve) insufficiency: Secondary | ICD-10-CM

## 2023-12-28 DIAGNOSIS — D631 Anemia in chronic kidney disease: Secondary | ICD-10-CM | POA: Diagnosis not present

## 2023-12-28 DIAGNOSIS — E038 Other specified hypothyroidism: Secondary | ICD-10-CM | POA: Diagnosis not present

## 2023-12-28 DIAGNOSIS — J439 Emphysema, unspecified: Secondary | ICD-10-CM | POA: Diagnosis not present

## 2023-12-28 DIAGNOSIS — I088 Other rheumatic multiple valve diseases: Secondary | ICD-10-CM | POA: Diagnosis not present

## 2023-12-28 DIAGNOSIS — I350 Nonrheumatic aortic (valve) stenosis: Secondary | ICD-10-CM

## 2023-12-28 DIAGNOSIS — G4700 Insomnia, unspecified: Secondary | ICD-10-CM | POA: Diagnosis not present

## 2023-12-28 DIAGNOSIS — J440 Chronic obstructive pulmonary disease with acute lower respiratory infection: Secondary | ICD-10-CM | POA: Diagnosis not present

## 2023-12-28 DIAGNOSIS — L89321 Pressure ulcer of left buttock, stage 1: Secondary | ICD-10-CM | POA: Diagnosis not present

## 2023-12-28 DIAGNOSIS — M8580 Other specified disorders of bone density and structure, unspecified site: Secondary | ICD-10-CM | POA: Diagnosis not present

## 2023-12-28 DIAGNOSIS — N183 Chronic kidney disease, stage 3 unspecified: Secondary | ICD-10-CM | POA: Diagnosis not present

## 2023-12-28 DIAGNOSIS — J441 Chronic obstructive pulmonary disease with (acute) exacerbation: Secondary | ICD-10-CM | POA: Diagnosis not present

## 2023-12-28 DIAGNOSIS — L89151 Pressure ulcer of sacral region, stage 1: Secondary | ICD-10-CM | POA: Diagnosis not present

## 2023-12-28 DIAGNOSIS — I480 Paroxysmal atrial fibrillation: Secondary | ICD-10-CM

## 2023-12-28 DIAGNOSIS — M150 Primary generalized (osteo)arthritis: Secondary | ICD-10-CM | POA: Diagnosis not present

## 2023-12-28 DIAGNOSIS — J9611 Chronic respiratory failure with hypoxia: Secondary | ICD-10-CM | POA: Diagnosis not present

## 2023-12-28 DIAGNOSIS — M47812 Spondylosis without myelopathy or radiculopathy, cervical region: Secondary | ICD-10-CM | POA: Diagnosis not present

## 2023-12-28 DIAGNOSIS — I5031 Acute diastolic (congestive) heart failure: Secondary | ICD-10-CM | POA: Diagnosis not present

## 2023-12-28 DIAGNOSIS — I872 Venous insufficiency (chronic) (peripheral): Secondary | ICD-10-CM | POA: Diagnosis not present

## 2023-12-28 DIAGNOSIS — E782 Mixed hyperlipidemia: Secondary | ICD-10-CM | POA: Diagnosis not present

## 2023-12-28 DIAGNOSIS — I4891 Unspecified atrial fibrillation: Secondary | ICD-10-CM | POA: Diagnosis not present

## 2023-12-28 DIAGNOSIS — I130 Hypertensive heart and chronic kidney disease with heart failure and stage 1 through stage 4 chronic kidney disease, or unspecified chronic kidney disease: Secondary | ICD-10-CM | POA: Diagnosis not present

## 2023-12-28 DIAGNOSIS — L89311 Pressure ulcer of right buttock, stage 1: Secondary | ICD-10-CM | POA: Diagnosis not present

## 2023-12-28 DIAGNOSIS — E559 Vitamin D deficiency, unspecified: Secondary | ICD-10-CM | POA: Diagnosis not present

## 2023-12-28 DIAGNOSIS — M109 Gout, unspecified: Secondary | ICD-10-CM | POA: Diagnosis not present

## 2023-12-28 DIAGNOSIS — G2581 Restless legs syndrome: Secondary | ICD-10-CM | POA: Diagnosis not present

## 2023-12-28 DIAGNOSIS — I160 Hypertensive urgency: Secondary | ICD-10-CM | POA: Diagnosis not present

## 2023-12-28 MED ORDER — DIGOXIN 125 MCG PO TABS
0.1250 mg | ORAL_TABLET | ORAL | 3 refills | Status: AC
Start: 2023-12-28 — End: ?

## 2023-12-28 NOTE — Progress Notes (Signed)
 Cardiology Office Note:    Date:  12/28/2023   ID:  Tiffany Mitchell, DOB 1930/04/13, MRN [id number]  PCP:  Montey Lot, PA-C  Cardiologist:  Jennifer JONELLE Crape, MD   Referring MD: Montey Lot, PA-C    ASSESSMENT:    1. PAF (paroxysmal atrial fibrillation) ([facility])   2. Essential hypertension   3. Aortic valve stenosis, etiology of cardiac valve disease unspecified   4. Moderate mitral insufficiency    PLAN:    In order of problems listed above:  Primary prevention stressed with the patient.  Importance of compliance with diet medication stressed and patient verbalized standing. Essential hypertension: Blood pressure is stable and diet was emphasized.  Lifestyle modification urged. Atrial fibrillation:I discussed with the patient atrial fibrillation, disease process. Management and therapy including rate and rhythm control, anticoagulation benefits and potential risks were discussed extensively with the patient. Patient had multiple questions which were answered to patient's satisfaction.  Rates are well-controlled and a drop digoxin  to every other day. Stable gait: She ambulates with a walker and family mentions to me that she is fine with her gait and has not had falls. Patient will be seen in follow-up appointment in 6 months or earlier if the patient has any concerns.    Medication Adjustments/Labs and Tests Ordered: Current medicines are reviewed at length with the patient today.  Concerns regarding medicines are outlined above.  No orders of the defined types were placed in this encounter.  No orders of the defined types were placed in this encounter.    No chief complaint on file.    History of Present Illness:    Tiffany Mitchell is a 89 y.o. female.  Patient has past medical history of mild aortic stenosis, mild to moderate mitral regurgitation, atrial fibrillation.  She is on anticoagulation.  She was admitted to the hospital and treated for pneumonitis.  I  reviewed those records.  She denies any chest pain orthopnea or PND.  She tells me that she is now back to her normal self.  She is using digoxin  on a regular basis.  At the time of my evaluation, the patient is alert awake oriented and in no distress.  Past Medical History:  Diagnosis Date   Aortic stenosis 02/24/2022   Bilateral leg edema    Bilateral leg edema    Calculus of gallbladder with chronic cholecystitis without obstruction 10/12/2016   Chronic kidney disease 07/09/2015   Cigarette smoker 07/09/2015   CKD (chronic kidney disease)    CKD (chronic kidney disease)    COPD (chronic obstructive pulmonary disease) ([facility])    Cystocele 02/12/2016   Diastolic dysfunction    DOE (dyspnea on exertion) 11/01/2019   Eczema    Essential hypertension 07/09/2015   Gout    Hypercalcemia    Hyperlipidemia    Lumbar pain with radiation down right leg    Lumbar pain with radiation down right leg    Mild aortic stenosis 02/28/2023   Mild mitral stenosis 02/28/2023   Moderate mitral insufficiency 07/09/2015   Osteoarthritis of left knee    Osteopenia    PAF (paroxysmal atrial fibrillation) ([facility]) 01/22/2020   Pain in both upper extremities    Pedal edema 11/01/2019   Peptic ulcer disease    Personal history of transient ischemic attack (TIA), and cerebral infarction without residual deficits 07/09/2015   Postoperative examination 02/12/2016   Pure hypercholesterolemia 07/09/2015   Stress incontinence in female 02/12/2016   Vitamin D deficiency  Past Surgical History:  Procedure Laterality Date   ABDOMINAL HYSTERECTOMY     APPENDECTOMY     KIDNEY SURGERY     OVARIAN CYST SURGERY     TUBAL LIGATION      Current Medications: Current Meds  Medication Sig   acetaminophen (TYLENOL) 650 MG CR tablet Take 650 mg by mouth every 8 (eight) hours as needed for pain.   albuterol  (VENTOLIN  HFA) 108 (90 Base) MCG/ACT inhaler Inhale 1-2 puffs into the lungs every 4 (four) hours as needed  for shortness of breath.   allopurinol (ZYLOPRIM) 100 MG tablet Take 200 mg by mouth daily.   carvedilol (COREG) 12.5 MG tablet Take 12.5 mg by mouth 2 (two) times daily.   Cholecalciferol (VITAMIN D3) 50 MCG (2000 UT) capsule Take 2,000 Units by mouth daily.   digoxin  (LANOXIN ) 0.125 MG tablet TAKE 1 TABLET(0.125 MG) BY MOUTH DAILY   doxazosin (CARDURA) 8 MG tablet Take 8 mg by mouth at bedtime.   hydrocortisone 2.5 % cream Apply 1 Application topically as needed for itching or rash.   losartan  (COZAAR ) 100 MG tablet Take 50 mg by mouth daily.   pantoprazole (PROTONIX) 40 MG tablet Take 40 mg by mouth daily.   pravastatin (PRAVACHOL) 40 MG tablet Take 40 mg by mouth at bedtime.   Prenatal Vit-Fe Fumarate-FA (PNV PRENATAL PLUS MULTIVITAMIN) 27-1 MG TABS Take 1 tablet by mouth daily.   torsemide (DEMADEX) 10 MG tablet Take 10 mg by mouth every morning.   traZODone (DESYREL) 100 MG tablet Take 100 mg by mouth at bedtime.   TRELEGY ELLIPTA 100-62.5-25 MCG/ACT AEPB Take 1 puff by mouth daily.   vitamin B-12 (CYANOCOBALAMIN ) 100 MCG tablet Take 100 mcg by mouth daily.   XARELTO  15 MG TABS tablet TAKE 1 TABLET(15 MG) BY MOUTH DAILY WITH SUPPER     Allergies:   Amlodipine and Hydrocodone-acetaminophen   Social History   Socioeconomic History   Marital status: Single    Spouse name: Not on file   Number of children: Not on file   Years of education: Not on file   Highest education level: Not on file  Occupational History   Not on file  Tobacco Use   Smoking status: Former    Current packs/day: 0.00    Types: Cigarettes    Quit date: 01/19/2020    Years since quitting: 3.9   Smokeless tobacco: Never  Substance and Sexual Activity   Alcohol use: Never   Drug use: Never   Sexual activity: Not on file  Other Topics Concern   Not on file  Social History Narrative   Not on file   Social Drivers of Health   Financial Resource Strain: Not on file  Food Insecurity: Not on file   Transportation Needs: Not on file  Physical Activity: Not on file  Stress: Not on file  Social Connections: Not on file     Family History: The patient's family history includes Diabetes in her sister.  ROS:   Please see the history of present illness.    All other systems reviewed and are negative.  EKGs/Labs/Other Studies Reviewed:    The following studies were reviewed today: Discussed my findings with the patient at length   Recent Labs: No results found for requested labs within last 365 days.  Recent Lipid Panel No results found for: CHOL, TRIG, HDL, CHOLHDL, VLDL, LDLCALC, LDLDIRECT  Physical Exam:    VS:  BP 138/82   Pulse 84   Ht  5' 1.6 (1.565 m)   Wt 155 lb 9.6 oz (70.6 kg)   SpO2 95%   BMI 28.83 kg/m     Wt Readings from Last 3 Encounters:  12/28/23 155 lb 9.6 oz (70.6 kg)  02/28/23 162 lb 6.4 oz (73.7 kg)  05/13/22 157 lb 9.6 oz (71.5 kg)     GEN: Patient is in no acute distress HEENT: Normal NECK: No JVD; No carotid bruits LYMPHATICS: No lymphadenopathy CARDIAC: Hear sounds regular, 2/6 systolic murmur at the apex. RESPIRATORY:  Clear to auscultation without rales, wheezing or rhonchi  ABDOMEN: Soft, non-tender, non-distended MUSCULOSKELETAL:  No edema; No deformity  SKIN: Warm and dry NEUROLOGIC:  Alert and oriented x 3 PSYCHIATRIC:  Normal affect   Signed, Jennifer JONELLE Crape, MD  12/28/2023 1:13 PM    [facility]

## 2023-12-28 NOTE — Patient Instructions (Signed)
 Medication Instructions:  Your physician has recommended you make the following change in your medication:   You will take your Digoxin  every other day   *If you need a refill on your cardiac medications before your next appointment, please call your pharmacy*   Lab Work: None ordered If you have labs (blood work) drawn today and your tests are completely normal, you will receive your results only by: MyChart Message (if you have MyChart) OR A paper copy in the mail If you have any lab test that is abnormal or we need to change your treatment, we will call you to review the results.   Testing/Procedures: None ordered   Follow-Up: At [facility], you and your health needs are our priority.  As part of our continuing mission to provide you with exceptional heart care, we have created designated Provider Care Teams.  These Care Teams include your primary Cardiologist (physician) and Advanced Practice Providers (APPs -  Physician Assistants and Nurse Practitioners) who all work together to provide you with the care you need, when you need it.  We recommend signing up for the patient portal called MyChart.  Sign up information is provided on this After Visit Summary.  MyChart is used to connect with patients for Virtual Visits (Telemedicine).  Patients are able to view lab/test results, encounter notes, upcoming appointments, etc.  Non-urgent messages can be sent to your provider as well.   To learn more about what you can do with MyChart, go to [email or web address].    Your next appointment:   9 month(s)  The format for your next appointment:   In Person  Provider:   Jennifer Crape, MD    Other Instructions none  Important Information About Sugar

## 2023-12-29 DIAGNOSIS — N183 Chronic kidney disease, stage 3 unspecified: Secondary | ICD-10-CM | POA: Diagnosis not present

## 2023-12-29 DIAGNOSIS — J441 Chronic obstructive pulmonary disease with (acute) exacerbation: Secondary | ICD-10-CM | POA: Diagnosis not present

## 2023-12-29 DIAGNOSIS — H905 Unspecified sensorineural hearing loss: Secondary | ICD-10-CM | POA: Diagnosis not present

## 2023-12-29 DIAGNOSIS — I4891 Unspecified atrial fibrillation: Secondary | ICD-10-CM | POA: Diagnosis not present

## 2023-12-29 DIAGNOSIS — I5030 Unspecified diastolic (congestive) heart failure: Secondary | ICD-10-CM | POA: Diagnosis not present

## 2024-01-01 DIAGNOSIS — G4700 Insomnia, unspecified: Secondary | ICD-10-CM | POA: Diagnosis not present

## 2024-01-01 DIAGNOSIS — E038 Other specified hypothyroidism: Secondary | ICD-10-CM | POA: Diagnosis not present

## 2024-01-01 DIAGNOSIS — L89151 Pressure ulcer of sacral region, stage 1: Secondary | ICD-10-CM | POA: Diagnosis not present

## 2024-01-01 DIAGNOSIS — G2581 Restless legs syndrome: Secondary | ICD-10-CM | POA: Diagnosis not present

## 2024-01-01 DIAGNOSIS — I160 Hypertensive urgency: Secondary | ICD-10-CM | POA: Diagnosis not present

## 2024-01-01 DIAGNOSIS — J9611 Chronic respiratory failure with hypoxia: Secondary | ICD-10-CM | POA: Diagnosis not present

## 2024-01-01 DIAGNOSIS — I872 Venous insufficiency (chronic) (peripheral): Secondary | ICD-10-CM | POA: Diagnosis not present

## 2024-01-01 DIAGNOSIS — M150 Primary generalized (osteo)arthritis: Secondary | ICD-10-CM | POA: Diagnosis not present

## 2024-01-01 DIAGNOSIS — I5031 Acute diastolic (congestive) heart failure: Secondary | ICD-10-CM | POA: Diagnosis not present

## 2024-01-01 DIAGNOSIS — M8580 Other specified disorders of bone density and structure, unspecified site: Secondary | ICD-10-CM | POA: Diagnosis not present

## 2024-01-01 DIAGNOSIS — J441 Chronic obstructive pulmonary disease with (acute) exacerbation: Secondary | ICD-10-CM | POA: Diagnosis not present

## 2024-01-01 DIAGNOSIS — J440 Chronic obstructive pulmonary disease with acute lower respiratory infection: Secondary | ICD-10-CM | POA: Diagnosis not present

## 2024-01-01 DIAGNOSIS — L89311 Pressure ulcer of right buttock, stage 1: Secondary | ICD-10-CM | POA: Diagnosis not present

## 2024-01-01 DIAGNOSIS — E559 Vitamin D deficiency, unspecified: Secondary | ICD-10-CM | POA: Diagnosis not present

## 2024-01-01 DIAGNOSIS — E782 Mixed hyperlipidemia: Secondary | ICD-10-CM | POA: Diagnosis not present

## 2024-01-01 DIAGNOSIS — J439 Emphysema, unspecified: Secondary | ICD-10-CM | POA: Diagnosis not present

## 2024-01-01 DIAGNOSIS — I4891 Unspecified atrial fibrillation: Secondary | ICD-10-CM | POA: Diagnosis not present

## 2024-01-01 DIAGNOSIS — D631 Anemia in chronic kidney disease: Secondary | ICD-10-CM | POA: Diagnosis not present

## 2024-01-01 DIAGNOSIS — N183 Chronic kidney disease, stage 3 unspecified: Secondary | ICD-10-CM | POA: Diagnosis not present

## 2024-01-01 DIAGNOSIS — M109 Gout, unspecified: Secondary | ICD-10-CM | POA: Diagnosis not present

## 2024-01-01 DIAGNOSIS — M47812 Spondylosis without myelopathy or radiculopathy, cervical region: Secondary | ICD-10-CM | POA: Diagnosis not present

## 2024-01-01 DIAGNOSIS — L89321 Pressure ulcer of left buttock, stage 1: Secondary | ICD-10-CM | POA: Diagnosis not present

## 2024-01-01 DIAGNOSIS — I130 Hypertensive heart and chronic kidney disease with heart failure and stage 1 through stage 4 chronic kidney disease, or unspecified chronic kidney disease: Secondary | ICD-10-CM | POA: Diagnosis not present

## 2024-01-01 DIAGNOSIS — I088 Other rheumatic multiple valve diseases: Secondary | ICD-10-CM | POA: Diagnosis not present

## 2024-01-02 DIAGNOSIS — J441 Chronic obstructive pulmonary disease with (acute) exacerbation: Secondary | ICD-10-CM | POA: Diagnosis not present

## 2024-01-02 DIAGNOSIS — I130 Hypertensive heart and chronic kidney disease with heart failure and stage 1 through stage 4 chronic kidney disease, or unspecified chronic kidney disease: Secondary | ICD-10-CM | POA: Diagnosis not present

## 2024-01-02 DIAGNOSIS — R5383 Other fatigue: Secondary | ICD-10-CM | POA: Diagnosis not present

## 2024-01-02 DIAGNOSIS — J440 Chronic obstructive pulmonary disease with acute lower respiratory infection: Secondary | ICD-10-CM | POA: Diagnosis not present

## 2024-01-02 DIAGNOSIS — L89311 Pressure ulcer of right buttock, stage 1: Secondary | ICD-10-CM | POA: Diagnosis not present

## 2024-01-02 DIAGNOSIS — I160 Hypertensive urgency: Secondary | ICD-10-CM | POA: Diagnosis not present

## 2024-01-02 DIAGNOSIS — M8580 Other specified disorders of bone density and structure, unspecified site: Secondary | ICD-10-CM | POA: Diagnosis not present

## 2024-01-02 DIAGNOSIS — E782 Mixed hyperlipidemia: Secondary | ICD-10-CM | POA: Diagnosis not present

## 2024-01-02 DIAGNOSIS — I5031 Acute diastolic (congestive) heart failure: Secondary | ICD-10-CM | POA: Diagnosis not present

## 2024-01-02 DIAGNOSIS — L89321 Pressure ulcer of left buttock, stage 1: Secondary | ICD-10-CM | POA: Diagnosis not present

## 2024-01-02 DIAGNOSIS — M150 Primary generalized (osteo)arthritis: Secondary | ICD-10-CM | POA: Diagnosis not present

## 2024-01-02 DIAGNOSIS — J9611 Chronic respiratory failure with hypoxia: Secondary | ICD-10-CM | POA: Diagnosis not present

## 2024-01-02 DIAGNOSIS — I088 Other rheumatic multiple valve diseases: Secondary | ICD-10-CM | POA: Diagnosis not present

## 2024-01-02 DIAGNOSIS — M47812 Spondylosis without myelopathy or radiculopathy, cervical region: Secondary | ICD-10-CM | POA: Diagnosis not present

## 2024-01-02 DIAGNOSIS — E038 Other specified hypothyroidism: Secondary | ICD-10-CM | POA: Diagnosis not present

## 2024-01-02 DIAGNOSIS — M109 Gout, unspecified: Secondary | ICD-10-CM | POA: Diagnosis not present

## 2024-01-02 DIAGNOSIS — E559 Vitamin D deficiency, unspecified: Secondary | ICD-10-CM | POA: Diagnosis not present

## 2024-01-02 DIAGNOSIS — M25519 Pain in unspecified shoulder: Secondary | ICD-10-CM | POA: Diagnosis not present

## 2024-01-02 DIAGNOSIS — G4700 Insomnia, unspecified: Secondary | ICD-10-CM | POA: Diagnosis not present

## 2024-01-02 DIAGNOSIS — I5022 Chronic systolic (congestive) heart failure: Secondary | ICD-10-CM | POA: Diagnosis not present

## 2024-01-02 DIAGNOSIS — N183 Chronic kidney disease, stage 3 unspecified: Secondary | ICD-10-CM | POA: Diagnosis not present

## 2024-01-02 DIAGNOSIS — L89151 Pressure ulcer of sacral region, stage 1: Secondary | ICD-10-CM | POA: Diagnosis not present

## 2024-01-02 DIAGNOSIS — J439 Emphysema, unspecified: Secondary | ICD-10-CM | POA: Diagnosis not present

## 2024-01-02 DIAGNOSIS — I872 Venous insufficiency (chronic) (peripheral): Secondary | ICD-10-CM | POA: Diagnosis not present

## 2024-01-02 DIAGNOSIS — D631 Anemia in chronic kidney disease: Secondary | ICD-10-CM | POA: Diagnosis not present

## 2024-01-02 DIAGNOSIS — G2581 Restless legs syndrome: Secondary | ICD-10-CM | POA: Diagnosis not present

## 2024-01-02 DIAGNOSIS — I4891 Unspecified atrial fibrillation: Secondary | ICD-10-CM | POA: Diagnosis not present

## 2024-01-04 DIAGNOSIS — N183 Chronic kidney disease, stage 3 unspecified: Secondary | ICD-10-CM | POA: Diagnosis not present

## 2024-01-04 DIAGNOSIS — M47812 Spondylosis without myelopathy or radiculopathy, cervical region: Secondary | ICD-10-CM | POA: Diagnosis not present

## 2024-01-04 DIAGNOSIS — J439 Emphysema, unspecified: Secondary | ICD-10-CM | POA: Diagnosis not present

## 2024-01-04 DIAGNOSIS — G2581 Restless legs syndrome: Secondary | ICD-10-CM | POA: Diagnosis not present

## 2024-01-04 DIAGNOSIS — J9611 Chronic respiratory failure with hypoxia: Secondary | ICD-10-CM | POA: Diagnosis not present

## 2024-01-04 DIAGNOSIS — L89311 Pressure ulcer of right buttock, stage 1: Secondary | ICD-10-CM | POA: Diagnosis not present

## 2024-01-04 DIAGNOSIS — E038 Other specified hypothyroidism: Secondary | ICD-10-CM | POA: Diagnosis not present

## 2024-01-04 DIAGNOSIS — I160 Hypertensive urgency: Secondary | ICD-10-CM | POA: Diagnosis not present

## 2024-01-04 DIAGNOSIS — I130 Hypertensive heart and chronic kidney disease with heart failure and stage 1 through stage 4 chronic kidney disease, or unspecified chronic kidney disease: Secondary | ICD-10-CM | POA: Diagnosis not present

## 2024-01-04 DIAGNOSIS — J440 Chronic obstructive pulmonary disease with acute lower respiratory infection: Secondary | ICD-10-CM | POA: Diagnosis not present

## 2024-01-04 DIAGNOSIS — D631 Anemia in chronic kidney disease: Secondary | ICD-10-CM | POA: Diagnosis not present

## 2024-01-04 DIAGNOSIS — I872 Venous insufficiency (chronic) (peripheral): Secondary | ICD-10-CM | POA: Diagnosis not present

## 2024-01-04 DIAGNOSIS — I088 Other rheumatic multiple valve diseases: Secondary | ICD-10-CM | POA: Diagnosis not present

## 2024-01-04 DIAGNOSIS — L89151 Pressure ulcer of sacral region, stage 1: Secondary | ICD-10-CM | POA: Diagnosis not present

## 2024-01-04 DIAGNOSIS — J441 Chronic obstructive pulmonary disease with (acute) exacerbation: Secondary | ICD-10-CM | POA: Diagnosis not present

## 2024-01-04 DIAGNOSIS — M109 Gout, unspecified: Secondary | ICD-10-CM | POA: Diagnosis not present

## 2024-01-04 DIAGNOSIS — M8580 Other specified disorders of bone density and structure, unspecified site: Secondary | ICD-10-CM | POA: Diagnosis not present

## 2024-01-04 DIAGNOSIS — E782 Mixed hyperlipidemia: Secondary | ICD-10-CM | POA: Diagnosis not present

## 2024-01-04 DIAGNOSIS — M150 Primary generalized (osteo)arthritis: Secondary | ICD-10-CM | POA: Diagnosis not present

## 2024-01-04 DIAGNOSIS — L89321 Pressure ulcer of left buttock, stage 1: Secondary | ICD-10-CM | POA: Diagnosis not present

## 2024-01-04 DIAGNOSIS — G4700 Insomnia, unspecified: Secondary | ICD-10-CM | POA: Diagnosis not present

## 2024-01-04 DIAGNOSIS — I4891 Unspecified atrial fibrillation: Secondary | ICD-10-CM | POA: Diagnosis not present

## 2024-01-04 DIAGNOSIS — I5031 Acute diastolic (congestive) heart failure: Secondary | ICD-10-CM | POA: Diagnosis not present

## 2024-01-04 DIAGNOSIS — E559 Vitamin D deficiency, unspecified: Secondary | ICD-10-CM | POA: Diagnosis not present

## 2024-01-08 DIAGNOSIS — G2581 Restless legs syndrome: Secondary | ICD-10-CM | POA: Diagnosis not present

## 2024-01-08 DIAGNOSIS — I5031 Acute diastolic (congestive) heart failure: Secondary | ICD-10-CM | POA: Diagnosis not present

## 2024-01-08 DIAGNOSIS — J9611 Chronic respiratory failure with hypoxia: Secondary | ICD-10-CM | POA: Diagnosis not present

## 2024-01-08 DIAGNOSIS — E782 Mixed hyperlipidemia: Secondary | ICD-10-CM | POA: Diagnosis not present

## 2024-01-08 DIAGNOSIS — I4891 Unspecified atrial fibrillation: Secondary | ICD-10-CM | POA: Diagnosis not present

## 2024-01-08 DIAGNOSIS — J441 Chronic obstructive pulmonary disease with (acute) exacerbation: Secondary | ICD-10-CM | POA: Diagnosis not present

## 2024-01-08 DIAGNOSIS — M8580 Other specified disorders of bone density and structure, unspecified site: Secondary | ICD-10-CM | POA: Diagnosis not present

## 2024-01-08 DIAGNOSIS — I088 Other rheumatic multiple valve diseases: Secondary | ICD-10-CM | POA: Diagnosis not present

## 2024-01-08 DIAGNOSIS — E038 Other specified hypothyroidism: Secondary | ICD-10-CM | POA: Diagnosis not present

## 2024-01-08 DIAGNOSIS — I130 Hypertensive heart and chronic kidney disease with heart failure and stage 1 through stage 4 chronic kidney disease, or unspecified chronic kidney disease: Secondary | ICD-10-CM | POA: Diagnosis not present

## 2024-01-08 DIAGNOSIS — J440 Chronic obstructive pulmonary disease with acute lower respiratory infection: Secondary | ICD-10-CM | POA: Diagnosis not present

## 2024-01-08 DIAGNOSIS — I872 Venous insufficiency (chronic) (peripheral): Secondary | ICD-10-CM | POA: Diagnosis not present

## 2024-01-08 DIAGNOSIS — N183 Chronic kidney disease, stage 3 unspecified: Secondary | ICD-10-CM | POA: Diagnosis not present

## 2024-01-08 DIAGNOSIS — L89151 Pressure ulcer of sacral region, stage 1: Secondary | ICD-10-CM | POA: Diagnosis not present

## 2024-01-08 DIAGNOSIS — I160 Hypertensive urgency: Secondary | ICD-10-CM | POA: Diagnosis not present

## 2024-01-08 DIAGNOSIS — M109 Gout, unspecified: Secondary | ICD-10-CM | POA: Diagnosis not present

## 2024-01-08 DIAGNOSIS — L89311 Pressure ulcer of right buttock, stage 1: Secondary | ICD-10-CM | POA: Diagnosis not present

## 2024-01-08 DIAGNOSIS — L89321 Pressure ulcer of left buttock, stage 1: Secondary | ICD-10-CM | POA: Diagnosis not present

## 2024-01-08 DIAGNOSIS — D631 Anemia in chronic kidney disease: Secondary | ICD-10-CM | POA: Diagnosis not present

## 2024-01-08 DIAGNOSIS — E559 Vitamin D deficiency, unspecified: Secondary | ICD-10-CM | POA: Diagnosis not present

## 2024-01-08 DIAGNOSIS — M150 Primary generalized (osteo)arthritis: Secondary | ICD-10-CM | POA: Diagnosis not present

## 2024-01-08 DIAGNOSIS — M47812 Spondylosis without myelopathy or radiculopathy, cervical region: Secondary | ICD-10-CM | POA: Diagnosis not present

## 2024-01-08 DIAGNOSIS — J439 Emphysema, unspecified: Secondary | ICD-10-CM | POA: Diagnosis not present

## 2024-01-08 DIAGNOSIS — G4700 Insomnia, unspecified: Secondary | ICD-10-CM | POA: Diagnosis not present

## 2024-01-10 DIAGNOSIS — E038 Other specified hypothyroidism: Secondary | ICD-10-CM | POA: Diagnosis not present

## 2024-01-10 DIAGNOSIS — D631 Anemia in chronic kidney disease: Secondary | ICD-10-CM | POA: Diagnosis not present

## 2024-01-10 DIAGNOSIS — J441 Chronic obstructive pulmonary disease with (acute) exacerbation: Secondary | ICD-10-CM | POA: Diagnosis not present

## 2024-01-10 DIAGNOSIS — M47812 Spondylosis without myelopathy or radiculopathy, cervical region: Secondary | ICD-10-CM | POA: Diagnosis not present

## 2024-01-10 DIAGNOSIS — L89311 Pressure ulcer of right buttock, stage 1: Secondary | ICD-10-CM | POA: Diagnosis not present

## 2024-01-10 DIAGNOSIS — I4891 Unspecified atrial fibrillation: Secondary | ICD-10-CM | POA: Diagnosis not present

## 2024-01-10 DIAGNOSIS — M8580 Other specified disorders of bone density and structure, unspecified site: Secondary | ICD-10-CM | POA: Diagnosis not present

## 2024-01-10 DIAGNOSIS — I160 Hypertensive urgency: Secondary | ICD-10-CM | POA: Diagnosis not present

## 2024-01-10 DIAGNOSIS — L89321 Pressure ulcer of left buttock, stage 1: Secondary | ICD-10-CM | POA: Diagnosis not present

## 2024-01-10 DIAGNOSIS — M150 Primary generalized (osteo)arthritis: Secondary | ICD-10-CM | POA: Diagnosis not present

## 2024-01-10 DIAGNOSIS — M109 Gout, unspecified: Secondary | ICD-10-CM | POA: Diagnosis not present

## 2024-01-10 DIAGNOSIS — N183 Chronic kidney disease, stage 3 unspecified: Secondary | ICD-10-CM | POA: Diagnosis not present

## 2024-01-10 DIAGNOSIS — J9611 Chronic respiratory failure with hypoxia: Secondary | ICD-10-CM | POA: Diagnosis not present

## 2024-01-10 DIAGNOSIS — J439 Emphysema, unspecified: Secondary | ICD-10-CM | POA: Diagnosis not present

## 2024-01-10 DIAGNOSIS — I872 Venous insufficiency (chronic) (peripheral): Secondary | ICD-10-CM | POA: Diagnosis not present

## 2024-01-10 DIAGNOSIS — L89151 Pressure ulcer of sacral region, stage 1: Secondary | ICD-10-CM | POA: Diagnosis not present

## 2024-01-10 DIAGNOSIS — J440 Chronic obstructive pulmonary disease with acute lower respiratory infection: Secondary | ICD-10-CM | POA: Diagnosis not present

## 2024-01-10 DIAGNOSIS — E782 Mixed hyperlipidemia: Secondary | ICD-10-CM | POA: Diagnosis not present

## 2024-01-10 DIAGNOSIS — I130 Hypertensive heart and chronic kidney disease with heart failure and stage 1 through stage 4 chronic kidney disease, or unspecified chronic kidney disease: Secondary | ICD-10-CM | POA: Diagnosis not present

## 2024-01-10 DIAGNOSIS — G4700 Insomnia, unspecified: Secondary | ICD-10-CM | POA: Diagnosis not present

## 2024-01-10 DIAGNOSIS — G2581 Restless legs syndrome: Secondary | ICD-10-CM | POA: Diagnosis not present

## 2024-01-10 DIAGNOSIS — I088 Other rheumatic multiple valve diseases: Secondary | ICD-10-CM | POA: Diagnosis not present

## 2024-01-10 DIAGNOSIS — I5031 Acute diastolic (congestive) heart failure: Secondary | ICD-10-CM | POA: Diagnosis not present

## 2024-01-10 DIAGNOSIS — E559 Vitamin D deficiency, unspecified: Secondary | ICD-10-CM | POA: Diagnosis not present

## 2024-01-12 DIAGNOSIS — M47812 Spondylosis without myelopathy or radiculopathy, cervical region: Secondary | ICD-10-CM | POA: Diagnosis not present

## 2024-01-12 DIAGNOSIS — D631 Anemia in chronic kidney disease: Secondary | ICD-10-CM | POA: Diagnosis not present

## 2024-01-12 DIAGNOSIS — J9611 Chronic respiratory failure with hypoxia: Secondary | ICD-10-CM | POA: Diagnosis not present

## 2024-01-12 DIAGNOSIS — J440 Chronic obstructive pulmonary disease with acute lower respiratory infection: Secondary | ICD-10-CM | POA: Diagnosis not present

## 2024-01-12 DIAGNOSIS — M150 Primary generalized (osteo)arthritis: Secondary | ICD-10-CM | POA: Diagnosis not present

## 2024-01-12 DIAGNOSIS — I5031 Acute diastolic (congestive) heart failure: Secondary | ICD-10-CM | POA: Diagnosis not present

## 2024-01-12 DIAGNOSIS — J439 Emphysema, unspecified: Secondary | ICD-10-CM | POA: Diagnosis not present

## 2024-01-12 DIAGNOSIS — E559 Vitamin D deficiency, unspecified: Secondary | ICD-10-CM | POA: Diagnosis not present

## 2024-01-12 DIAGNOSIS — I088 Other rheumatic multiple valve diseases: Secondary | ICD-10-CM | POA: Diagnosis not present

## 2024-01-12 DIAGNOSIS — N183 Chronic kidney disease, stage 3 unspecified: Secondary | ICD-10-CM | POA: Diagnosis not present

## 2024-01-12 DIAGNOSIS — M109 Gout, unspecified: Secondary | ICD-10-CM | POA: Diagnosis not present

## 2024-01-12 DIAGNOSIS — L89151 Pressure ulcer of sacral region, stage 1: Secondary | ICD-10-CM | POA: Diagnosis not present

## 2024-01-12 DIAGNOSIS — J441 Chronic obstructive pulmonary disease with (acute) exacerbation: Secondary | ICD-10-CM | POA: Diagnosis not present

## 2024-01-12 DIAGNOSIS — I872 Venous insufficiency (chronic) (peripheral): Secondary | ICD-10-CM | POA: Diagnosis not present

## 2024-01-12 DIAGNOSIS — I130 Hypertensive heart and chronic kidney disease with heart failure and stage 1 through stage 4 chronic kidney disease, or unspecified chronic kidney disease: Secondary | ICD-10-CM | POA: Diagnosis not present

## 2024-01-12 DIAGNOSIS — I4891 Unspecified atrial fibrillation: Secondary | ICD-10-CM | POA: Diagnosis not present

## 2024-01-12 DIAGNOSIS — L89321 Pressure ulcer of left buttock, stage 1: Secondary | ICD-10-CM | POA: Diagnosis not present

## 2024-01-12 DIAGNOSIS — E782 Mixed hyperlipidemia: Secondary | ICD-10-CM | POA: Diagnosis not present

## 2024-01-12 DIAGNOSIS — G4700 Insomnia, unspecified: Secondary | ICD-10-CM | POA: Diagnosis not present

## 2024-01-12 DIAGNOSIS — M8580 Other specified disorders of bone density and structure, unspecified site: Secondary | ICD-10-CM | POA: Diagnosis not present

## 2024-01-12 DIAGNOSIS — I160 Hypertensive urgency: Secondary | ICD-10-CM | POA: Diagnosis not present

## 2024-01-12 DIAGNOSIS — G2581 Restless legs syndrome: Secondary | ICD-10-CM | POA: Diagnosis not present

## 2024-01-12 DIAGNOSIS — E038 Other specified hypothyroidism: Secondary | ICD-10-CM | POA: Diagnosis not present

## 2024-01-12 DIAGNOSIS — L89311 Pressure ulcer of right buttock, stage 1: Secondary | ICD-10-CM | POA: Diagnosis not present

## 2024-01-15 DIAGNOSIS — M8580 Other specified disorders of bone density and structure, unspecified site: Secondary | ICD-10-CM | POA: Diagnosis not present

## 2024-01-15 DIAGNOSIS — L89151 Pressure ulcer of sacral region, stage 1: Secondary | ICD-10-CM | POA: Diagnosis not present

## 2024-01-15 DIAGNOSIS — I160 Hypertensive urgency: Secondary | ICD-10-CM | POA: Diagnosis not present

## 2024-01-15 DIAGNOSIS — M150 Primary generalized (osteo)arthritis: Secondary | ICD-10-CM | POA: Diagnosis not present

## 2024-01-15 DIAGNOSIS — I5031 Acute diastolic (congestive) heart failure: Secondary | ICD-10-CM | POA: Diagnosis not present

## 2024-01-15 DIAGNOSIS — G4700 Insomnia, unspecified: Secondary | ICD-10-CM | POA: Diagnosis not present

## 2024-01-15 DIAGNOSIS — M109 Gout, unspecified: Secondary | ICD-10-CM | POA: Diagnosis not present

## 2024-01-15 DIAGNOSIS — E559 Vitamin D deficiency, unspecified: Secondary | ICD-10-CM | POA: Diagnosis not present

## 2024-01-15 DIAGNOSIS — E038 Other specified hypothyroidism: Secondary | ICD-10-CM | POA: Diagnosis not present

## 2024-01-15 DIAGNOSIS — E782 Mixed hyperlipidemia: Secondary | ICD-10-CM | POA: Diagnosis not present

## 2024-01-15 DIAGNOSIS — J439 Emphysema, unspecified: Secondary | ICD-10-CM | POA: Diagnosis not present

## 2024-01-15 DIAGNOSIS — I088 Other rheumatic multiple valve diseases: Secondary | ICD-10-CM | POA: Diagnosis not present

## 2024-01-15 DIAGNOSIS — N183 Chronic kidney disease, stage 3 unspecified: Secondary | ICD-10-CM | POA: Diagnosis not present

## 2024-01-15 DIAGNOSIS — J441 Chronic obstructive pulmonary disease with (acute) exacerbation: Secondary | ICD-10-CM | POA: Diagnosis not present

## 2024-01-15 DIAGNOSIS — G2581 Restless legs syndrome: Secondary | ICD-10-CM | POA: Diagnosis not present

## 2024-01-15 DIAGNOSIS — D631 Anemia in chronic kidney disease: Secondary | ICD-10-CM | POA: Diagnosis not present

## 2024-01-15 DIAGNOSIS — I4891 Unspecified atrial fibrillation: Secondary | ICD-10-CM | POA: Diagnosis not present

## 2024-01-15 DIAGNOSIS — I872 Venous insufficiency (chronic) (peripheral): Secondary | ICD-10-CM | POA: Diagnosis not present

## 2024-01-15 DIAGNOSIS — I130 Hypertensive heart and chronic kidney disease with heart failure and stage 1 through stage 4 chronic kidney disease, or unspecified chronic kidney disease: Secondary | ICD-10-CM | POA: Diagnosis not present

## 2024-01-15 DIAGNOSIS — J440 Chronic obstructive pulmonary disease with acute lower respiratory infection: Secondary | ICD-10-CM | POA: Diagnosis not present

## 2024-01-15 DIAGNOSIS — L89321 Pressure ulcer of left buttock, stage 1: Secondary | ICD-10-CM | POA: Diagnosis not present

## 2024-01-15 DIAGNOSIS — M47812 Spondylosis without myelopathy or radiculopathy, cervical region: Secondary | ICD-10-CM | POA: Diagnosis not present

## 2024-01-15 DIAGNOSIS — J9611 Chronic respiratory failure with hypoxia: Secondary | ICD-10-CM | POA: Diagnosis not present

## 2024-01-15 DIAGNOSIS — L89311 Pressure ulcer of right buttock, stage 1: Secondary | ICD-10-CM | POA: Diagnosis not present

## 2024-01-16 DIAGNOSIS — E038 Other specified hypothyroidism: Secondary | ICD-10-CM | POA: Diagnosis not present

## 2024-01-16 DIAGNOSIS — I4891 Unspecified atrial fibrillation: Secondary | ICD-10-CM | POA: Diagnosis not present

## 2024-01-16 DIAGNOSIS — I088 Other rheumatic multiple valve diseases: Secondary | ICD-10-CM | POA: Diagnosis not present

## 2024-01-16 DIAGNOSIS — J439 Emphysema, unspecified: Secondary | ICD-10-CM | POA: Diagnosis not present

## 2024-01-16 DIAGNOSIS — N183 Chronic kidney disease, stage 3 unspecified: Secondary | ICD-10-CM | POA: Diagnosis not present

## 2024-01-16 DIAGNOSIS — J441 Chronic obstructive pulmonary disease with (acute) exacerbation: Secondary | ICD-10-CM | POA: Diagnosis not present

## 2024-01-16 DIAGNOSIS — E559 Vitamin D deficiency, unspecified: Secondary | ICD-10-CM | POA: Diagnosis not present

## 2024-01-16 DIAGNOSIS — M150 Primary generalized (osteo)arthritis: Secondary | ICD-10-CM | POA: Diagnosis not present

## 2024-01-16 DIAGNOSIS — I5031 Acute diastolic (congestive) heart failure: Secondary | ICD-10-CM | POA: Diagnosis not present

## 2024-01-16 DIAGNOSIS — L89151 Pressure ulcer of sacral region, stage 1: Secondary | ICD-10-CM | POA: Diagnosis not present

## 2024-01-16 DIAGNOSIS — M8580 Other specified disorders of bone density and structure, unspecified site: Secondary | ICD-10-CM | POA: Diagnosis not present

## 2024-01-16 DIAGNOSIS — L89311 Pressure ulcer of right buttock, stage 1: Secondary | ICD-10-CM | POA: Diagnosis not present

## 2024-01-16 DIAGNOSIS — J9611 Chronic respiratory failure with hypoxia: Secondary | ICD-10-CM | POA: Diagnosis not present

## 2024-01-16 DIAGNOSIS — G2581 Restless legs syndrome: Secondary | ICD-10-CM | POA: Diagnosis not present

## 2024-01-16 DIAGNOSIS — M109 Gout, unspecified: Secondary | ICD-10-CM | POA: Diagnosis not present

## 2024-01-16 DIAGNOSIS — D631 Anemia in chronic kidney disease: Secondary | ICD-10-CM | POA: Diagnosis not present

## 2024-01-16 DIAGNOSIS — G4700 Insomnia, unspecified: Secondary | ICD-10-CM | POA: Diagnosis not present

## 2024-01-16 DIAGNOSIS — I872 Venous insufficiency (chronic) (peripheral): Secondary | ICD-10-CM | POA: Diagnosis not present

## 2024-01-16 DIAGNOSIS — I160 Hypertensive urgency: Secondary | ICD-10-CM | POA: Diagnosis not present

## 2024-01-16 DIAGNOSIS — I130 Hypertensive heart and chronic kidney disease with heart failure and stage 1 through stage 4 chronic kidney disease, or unspecified chronic kidney disease: Secondary | ICD-10-CM | POA: Diagnosis not present

## 2024-01-16 DIAGNOSIS — L89321 Pressure ulcer of left buttock, stage 1: Secondary | ICD-10-CM | POA: Diagnosis not present

## 2024-01-16 DIAGNOSIS — J440 Chronic obstructive pulmonary disease with acute lower respiratory infection: Secondary | ICD-10-CM | POA: Diagnosis not present

## 2024-01-16 DIAGNOSIS — M47812 Spondylosis without myelopathy or radiculopathy, cervical region: Secondary | ICD-10-CM | POA: Diagnosis not present

## 2024-01-16 DIAGNOSIS — E782 Mixed hyperlipidemia: Secondary | ICD-10-CM | POA: Diagnosis not present

## 2024-01-18 DIAGNOSIS — R0902 Hypoxemia: Secondary | ICD-10-CM | POA: Diagnosis not present

## 2024-01-18 DIAGNOSIS — R918 Other nonspecific abnormal finding of lung field: Secondary | ICD-10-CM | POA: Diagnosis not present

## 2024-01-18 DIAGNOSIS — J449 Chronic obstructive pulmonary disease, unspecified: Secondary | ICD-10-CM | POA: Diagnosis not present

## 2024-01-20 DIAGNOSIS — I872 Venous insufficiency (chronic) (peripheral): Secondary | ICD-10-CM | POA: Diagnosis not present

## 2024-01-20 DIAGNOSIS — J441 Chronic obstructive pulmonary disease with (acute) exacerbation: Secondary | ICD-10-CM | POA: Diagnosis not present

## 2024-01-20 DIAGNOSIS — M47812 Spondylosis without myelopathy or radiculopathy, cervical region: Secondary | ICD-10-CM | POA: Diagnosis not present

## 2024-01-20 DIAGNOSIS — I088 Other rheumatic multiple valve diseases: Secondary | ICD-10-CM | POA: Diagnosis not present

## 2024-01-20 DIAGNOSIS — L89311 Pressure ulcer of right buttock, stage 1: Secondary | ICD-10-CM | POA: Diagnosis not present

## 2024-01-20 DIAGNOSIS — E559 Vitamin D deficiency, unspecified: Secondary | ICD-10-CM | POA: Diagnosis not present

## 2024-01-20 DIAGNOSIS — G4700 Insomnia, unspecified: Secondary | ICD-10-CM | POA: Diagnosis not present

## 2024-01-20 DIAGNOSIS — G2581 Restless legs syndrome: Secondary | ICD-10-CM | POA: Diagnosis not present

## 2024-01-20 DIAGNOSIS — M109 Gout, unspecified: Secondary | ICD-10-CM | POA: Diagnosis not present

## 2024-01-20 DIAGNOSIS — I130 Hypertensive heart and chronic kidney disease with heart failure and stage 1 through stage 4 chronic kidney disease, or unspecified chronic kidney disease: Secondary | ICD-10-CM | POA: Diagnosis not present

## 2024-01-20 DIAGNOSIS — E038 Other specified hypothyroidism: Secondary | ICD-10-CM | POA: Diagnosis not present

## 2024-01-20 DIAGNOSIS — I5031 Acute diastolic (congestive) heart failure: Secondary | ICD-10-CM | POA: Diagnosis not present

## 2024-01-20 DIAGNOSIS — N183 Chronic kidney disease, stage 3 unspecified: Secondary | ICD-10-CM | POA: Diagnosis not present

## 2024-01-20 DIAGNOSIS — I160 Hypertensive urgency: Secondary | ICD-10-CM | POA: Diagnosis not present

## 2024-01-20 DIAGNOSIS — M8580 Other specified disorders of bone density and structure, unspecified site: Secondary | ICD-10-CM | POA: Diagnosis not present

## 2024-01-20 DIAGNOSIS — L89151 Pressure ulcer of sacral region, stage 1: Secondary | ICD-10-CM | POA: Diagnosis not present

## 2024-01-20 DIAGNOSIS — J9611 Chronic respiratory failure with hypoxia: Secondary | ICD-10-CM | POA: Diagnosis not present

## 2024-01-20 DIAGNOSIS — J439 Emphysema, unspecified: Secondary | ICD-10-CM | POA: Diagnosis not present

## 2024-01-20 DIAGNOSIS — D631 Anemia in chronic kidney disease: Secondary | ICD-10-CM | POA: Diagnosis not present

## 2024-01-20 DIAGNOSIS — E782 Mixed hyperlipidemia: Secondary | ICD-10-CM | POA: Diagnosis not present

## 2024-01-20 DIAGNOSIS — L89321 Pressure ulcer of left buttock, stage 1: Secondary | ICD-10-CM | POA: Diagnosis not present

## 2024-01-20 DIAGNOSIS — J440 Chronic obstructive pulmonary disease with acute lower respiratory infection: Secondary | ICD-10-CM | POA: Diagnosis not present

## 2024-01-20 DIAGNOSIS — I4891 Unspecified atrial fibrillation: Secondary | ICD-10-CM | POA: Diagnosis not present

## 2024-01-20 DIAGNOSIS — M150 Primary generalized (osteo)arthritis: Secondary | ICD-10-CM | POA: Diagnosis not present

## 2024-01-22 DIAGNOSIS — L89321 Pressure ulcer of left buttock, stage 1: Secondary | ICD-10-CM | POA: Diagnosis not present

## 2024-01-22 DIAGNOSIS — E559 Vitamin D deficiency, unspecified: Secondary | ICD-10-CM | POA: Diagnosis not present

## 2024-01-22 DIAGNOSIS — I872 Venous insufficiency (chronic) (peripheral): Secondary | ICD-10-CM | POA: Diagnosis not present

## 2024-01-22 DIAGNOSIS — I088 Other rheumatic multiple valve diseases: Secondary | ICD-10-CM | POA: Diagnosis not present

## 2024-01-22 DIAGNOSIS — L89151 Pressure ulcer of sacral region, stage 1: Secondary | ICD-10-CM | POA: Diagnosis not present

## 2024-01-22 DIAGNOSIS — E782 Mixed hyperlipidemia: Secondary | ICD-10-CM | POA: Diagnosis not present

## 2024-01-22 DIAGNOSIS — J440 Chronic obstructive pulmonary disease with acute lower respiratory infection: Secondary | ICD-10-CM | POA: Diagnosis not present

## 2024-01-22 DIAGNOSIS — M150 Primary generalized (osteo)arthritis: Secondary | ICD-10-CM | POA: Diagnosis not present

## 2024-01-22 DIAGNOSIS — J439 Emphysema, unspecified: Secondary | ICD-10-CM | POA: Diagnosis not present

## 2024-01-22 DIAGNOSIS — M8580 Other specified disorders of bone density and structure, unspecified site: Secondary | ICD-10-CM | POA: Diagnosis not present

## 2024-01-22 DIAGNOSIS — J9611 Chronic respiratory failure with hypoxia: Secondary | ICD-10-CM | POA: Diagnosis not present

## 2024-01-22 DIAGNOSIS — D631 Anemia in chronic kidney disease: Secondary | ICD-10-CM | POA: Diagnosis not present

## 2024-01-22 DIAGNOSIS — G2581 Restless legs syndrome: Secondary | ICD-10-CM | POA: Diagnosis not present

## 2024-01-22 DIAGNOSIS — I130 Hypertensive heart and chronic kidney disease with heart failure and stage 1 through stage 4 chronic kidney disease, or unspecified chronic kidney disease: Secondary | ICD-10-CM | POA: Diagnosis not present

## 2024-01-22 DIAGNOSIS — I4891 Unspecified atrial fibrillation: Secondary | ICD-10-CM | POA: Diagnosis not present

## 2024-01-22 DIAGNOSIS — M47812 Spondylosis without myelopathy or radiculopathy, cervical region: Secondary | ICD-10-CM | POA: Diagnosis not present

## 2024-01-22 DIAGNOSIS — L89311 Pressure ulcer of right buttock, stage 1: Secondary | ICD-10-CM | POA: Diagnosis not present

## 2024-01-22 DIAGNOSIS — I5031 Acute diastolic (congestive) heart failure: Secondary | ICD-10-CM | POA: Diagnosis not present

## 2024-01-22 DIAGNOSIS — N183 Chronic kidney disease, stage 3 unspecified: Secondary | ICD-10-CM | POA: Diagnosis not present

## 2024-01-22 DIAGNOSIS — E038 Other specified hypothyroidism: Secondary | ICD-10-CM | POA: Diagnosis not present

## 2024-01-22 DIAGNOSIS — I160 Hypertensive urgency: Secondary | ICD-10-CM | POA: Diagnosis not present

## 2024-01-22 DIAGNOSIS — M109 Gout, unspecified: Secondary | ICD-10-CM | POA: Diagnosis not present

## 2024-01-22 DIAGNOSIS — G4700 Insomnia, unspecified: Secondary | ICD-10-CM | POA: Diagnosis not present

## 2024-01-22 DIAGNOSIS — J441 Chronic obstructive pulmonary disease with (acute) exacerbation: Secondary | ICD-10-CM | POA: Diagnosis not present

## 2024-01-23 DIAGNOSIS — G2581 Restless legs syndrome: Secondary | ICD-10-CM | POA: Diagnosis not present

## 2024-01-23 DIAGNOSIS — N183 Chronic kidney disease, stage 3 unspecified: Secondary | ICD-10-CM | POA: Diagnosis not present

## 2024-01-23 DIAGNOSIS — I088 Other rheumatic multiple valve diseases: Secondary | ICD-10-CM | POA: Diagnosis not present

## 2024-01-23 DIAGNOSIS — M47812 Spondylosis without myelopathy or radiculopathy, cervical region: Secondary | ICD-10-CM | POA: Diagnosis not present

## 2024-01-23 DIAGNOSIS — J440 Chronic obstructive pulmonary disease with acute lower respiratory infection: Secondary | ICD-10-CM | POA: Diagnosis not present

## 2024-01-23 DIAGNOSIS — I4891 Unspecified atrial fibrillation: Secondary | ICD-10-CM | POA: Diagnosis not present

## 2024-01-23 DIAGNOSIS — M109 Gout, unspecified: Secondary | ICD-10-CM | POA: Diagnosis not present

## 2024-01-23 DIAGNOSIS — I872 Venous insufficiency (chronic) (peripheral): Secondary | ICD-10-CM | POA: Diagnosis not present

## 2024-01-23 DIAGNOSIS — L89151 Pressure ulcer of sacral region, stage 1: Secondary | ICD-10-CM | POA: Diagnosis not present

## 2024-01-23 DIAGNOSIS — L89321 Pressure ulcer of left buttock, stage 1: Secondary | ICD-10-CM | POA: Diagnosis not present

## 2024-01-23 DIAGNOSIS — L89311 Pressure ulcer of right buttock, stage 1: Secondary | ICD-10-CM | POA: Diagnosis not present

## 2024-01-23 DIAGNOSIS — E782 Mixed hyperlipidemia: Secondary | ICD-10-CM | POA: Diagnosis not present

## 2024-01-23 DIAGNOSIS — E559 Vitamin D deficiency, unspecified: Secondary | ICD-10-CM | POA: Diagnosis not present

## 2024-01-23 DIAGNOSIS — G4700 Insomnia, unspecified: Secondary | ICD-10-CM | POA: Diagnosis not present

## 2024-01-23 DIAGNOSIS — M150 Primary generalized (osteo)arthritis: Secondary | ICD-10-CM | POA: Diagnosis not present

## 2024-01-23 DIAGNOSIS — M8580 Other specified disorders of bone density and structure, unspecified site: Secondary | ICD-10-CM | POA: Diagnosis not present

## 2024-01-23 DIAGNOSIS — I130 Hypertensive heart and chronic kidney disease with heart failure and stage 1 through stage 4 chronic kidney disease, or unspecified chronic kidney disease: Secondary | ICD-10-CM | POA: Diagnosis not present

## 2024-01-23 DIAGNOSIS — J439 Emphysema, unspecified: Secondary | ICD-10-CM | POA: Diagnosis not present

## 2024-01-23 DIAGNOSIS — I5031 Acute diastolic (congestive) heart failure: Secondary | ICD-10-CM | POA: Diagnosis not present

## 2024-01-23 DIAGNOSIS — E038 Other specified hypothyroidism: Secondary | ICD-10-CM | POA: Diagnosis not present

## 2024-01-23 DIAGNOSIS — D631 Anemia in chronic kidney disease: Secondary | ICD-10-CM | POA: Diagnosis not present

## 2024-01-23 DIAGNOSIS — J9611 Chronic respiratory failure with hypoxia: Secondary | ICD-10-CM | POA: Diagnosis not present

## 2024-01-23 DIAGNOSIS — J441 Chronic obstructive pulmonary disease with (acute) exacerbation: Secondary | ICD-10-CM | POA: Diagnosis not present

## 2024-01-23 DIAGNOSIS — I160 Hypertensive urgency: Secondary | ICD-10-CM | POA: Diagnosis not present

## 2024-01-24 DIAGNOSIS — G4700 Insomnia, unspecified: Secondary | ICD-10-CM | POA: Diagnosis not present

## 2024-01-24 DIAGNOSIS — E782 Mixed hyperlipidemia: Secondary | ICD-10-CM | POA: Diagnosis not present

## 2024-01-24 DIAGNOSIS — D631 Anemia in chronic kidney disease: Secondary | ICD-10-CM | POA: Diagnosis not present

## 2024-01-24 DIAGNOSIS — M150 Primary generalized (osteo)arthritis: Secondary | ICD-10-CM | POA: Diagnosis not present

## 2024-01-24 DIAGNOSIS — J441 Chronic obstructive pulmonary disease with (acute) exacerbation: Secondary | ICD-10-CM | POA: Diagnosis not present

## 2024-01-24 DIAGNOSIS — J9611 Chronic respiratory failure with hypoxia: Secondary | ICD-10-CM | POA: Diagnosis not present

## 2024-01-24 DIAGNOSIS — I5031 Acute diastolic (congestive) heart failure: Secondary | ICD-10-CM | POA: Diagnosis not present

## 2024-01-24 DIAGNOSIS — I088 Other rheumatic multiple valve diseases: Secondary | ICD-10-CM | POA: Diagnosis not present

## 2024-01-24 DIAGNOSIS — I872 Venous insufficiency (chronic) (peripheral): Secondary | ICD-10-CM | POA: Diagnosis not present

## 2024-01-24 DIAGNOSIS — M109 Gout, unspecified: Secondary | ICD-10-CM | POA: Diagnosis not present

## 2024-01-24 DIAGNOSIS — I160 Hypertensive urgency: Secondary | ICD-10-CM | POA: Diagnosis not present

## 2024-01-24 DIAGNOSIS — L89151 Pressure ulcer of sacral region, stage 1: Secondary | ICD-10-CM | POA: Diagnosis not present

## 2024-01-24 DIAGNOSIS — I4891 Unspecified atrial fibrillation: Secondary | ICD-10-CM | POA: Diagnosis not present

## 2024-01-24 DIAGNOSIS — J440 Chronic obstructive pulmonary disease with acute lower respiratory infection: Secondary | ICD-10-CM | POA: Diagnosis not present

## 2024-01-24 DIAGNOSIS — J439 Emphysema, unspecified: Secondary | ICD-10-CM | POA: Diagnosis not present

## 2024-01-24 DIAGNOSIS — N183 Chronic kidney disease, stage 3 unspecified: Secondary | ICD-10-CM | POA: Diagnosis not present

## 2024-01-24 DIAGNOSIS — E559 Vitamin D deficiency, unspecified: Secondary | ICD-10-CM | POA: Diagnosis not present

## 2024-01-24 DIAGNOSIS — M47812 Spondylosis without myelopathy or radiculopathy, cervical region: Secondary | ICD-10-CM | POA: Diagnosis not present

## 2024-01-24 DIAGNOSIS — M8580 Other specified disorders of bone density and structure, unspecified site: Secondary | ICD-10-CM | POA: Diagnosis not present

## 2024-01-24 DIAGNOSIS — E038 Other specified hypothyroidism: Secondary | ICD-10-CM | POA: Diagnosis not present

## 2024-01-24 DIAGNOSIS — L89311 Pressure ulcer of right buttock, stage 1: Secondary | ICD-10-CM | POA: Diagnosis not present

## 2024-01-24 DIAGNOSIS — I130 Hypertensive heart and chronic kidney disease with heart failure and stage 1 through stage 4 chronic kidney disease, or unspecified chronic kidney disease: Secondary | ICD-10-CM | POA: Diagnosis not present

## 2024-01-24 DIAGNOSIS — G2581 Restless legs syndrome: Secondary | ICD-10-CM | POA: Diagnosis not present

## 2024-01-24 DIAGNOSIS — L89321 Pressure ulcer of left buttock, stage 1: Secondary | ICD-10-CM | POA: Diagnosis not present

## 2024-01-25 DIAGNOSIS — E038 Other specified hypothyroidism: Secondary | ICD-10-CM | POA: Diagnosis not present

## 2024-01-25 DIAGNOSIS — D631 Anemia in chronic kidney disease: Secondary | ICD-10-CM | POA: Diagnosis not present

## 2024-01-25 DIAGNOSIS — J9611 Chronic respiratory failure with hypoxia: Secondary | ICD-10-CM | POA: Diagnosis not present

## 2024-01-25 DIAGNOSIS — J439 Emphysema, unspecified: Secondary | ICD-10-CM | POA: Diagnosis not present

## 2024-01-25 DIAGNOSIS — I872 Venous insufficiency (chronic) (peripheral): Secondary | ICD-10-CM | POA: Diagnosis not present

## 2024-01-25 DIAGNOSIS — E559 Vitamin D deficiency, unspecified: Secondary | ICD-10-CM | POA: Diagnosis not present

## 2024-01-25 DIAGNOSIS — M8580 Other specified disorders of bone density and structure, unspecified site: Secondary | ICD-10-CM | POA: Diagnosis not present

## 2024-01-25 DIAGNOSIS — J440 Chronic obstructive pulmonary disease with acute lower respiratory infection: Secondary | ICD-10-CM | POA: Diagnosis not present

## 2024-01-25 DIAGNOSIS — G4700 Insomnia, unspecified: Secondary | ICD-10-CM | POA: Diagnosis not present

## 2024-01-25 DIAGNOSIS — E782 Mixed hyperlipidemia: Secondary | ICD-10-CM | POA: Diagnosis not present

## 2024-01-25 DIAGNOSIS — M109 Gout, unspecified: Secondary | ICD-10-CM | POA: Diagnosis not present

## 2024-01-25 DIAGNOSIS — I160 Hypertensive urgency: Secondary | ICD-10-CM | POA: Diagnosis not present

## 2024-01-25 DIAGNOSIS — I130 Hypertensive heart and chronic kidney disease with heart failure and stage 1 through stage 4 chronic kidney disease, or unspecified chronic kidney disease: Secondary | ICD-10-CM | POA: Diagnosis not present

## 2024-01-25 DIAGNOSIS — L89151 Pressure ulcer of sacral region, stage 1: Secondary | ICD-10-CM | POA: Diagnosis not present

## 2024-01-25 DIAGNOSIS — N183 Chronic kidney disease, stage 3 unspecified: Secondary | ICD-10-CM | POA: Diagnosis not present

## 2024-01-25 DIAGNOSIS — J441 Chronic obstructive pulmonary disease with (acute) exacerbation: Secondary | ICD-10-CM | POA: Diagnosis not present

## 2024-01-25 DIAGNOSIS — G2581 Restless legs syndrome: Secondary | ICD-10-CM | POA: Diagnosis not present

## 2024-01-25 DIAGNOSIS — M150 Primary generalized (osteo)arthritis: Secondary | ICD-10-CM | POA: Diagnosis not present

## 2024-01-25 DIAGNOSIS — L89321 Pressure ulcer of left buttock, stage 1: Secondary | ICD-10-CM | POA: Diagnosis not present

## 2024-01-25 DIAGNOSIS — M47812 Spondylosis without myelopathy or radiculopathy, cervical region: Secondary | ICD-10-CM | POA: Diagnosis not present

## 2024-01-25 DIAGNOSIS — L89311 Pressure ulcer of right buttock, stage 1: Secondary | ICD-10-CM | POA: Diagnosis not present

## 2024-01-25 DIAGNOSIS — I4891 Unspecified atrial fibrillation: Secondary | ICD-10-CM | POA: Diagnosis not present

## 2024-01-25 DIAGNOSIS — I5031 Acute diastolic (congestive) heart failure: Secondary | ICD-10-CM | POA: Diagnosis not present

## 2024-01-25 DIAGNOSIS — I088 Other rheumatic multiple valve diseases: Secondary | ICD-10-CM | POA: Diagnosis not present

## 2024-01-29 DIAGNOSIS — J440 Chronic obstructive pulmonary disease with acute lower respiratory infection: Secondary | ICD-10-CM | POA: Diagnosis not present

## 2024-01-29 DIAGNOSIS — I872 Venous insufficiency (chronic) (peripheral): Secondary | ICD-10-CM | POA: Diagnosis not present

## 2024-01-29 DIAGNOSIS — M109 Gout, unspecified: Secondary | ICD-10-CM | POA: Diagnosis not present

## 2024-01-29 DIAGNOSIS — M47812 Spondylosis without myelopathy or radiculopathy, cervical region: Secondary | ICD-10-CM | POA: Diagnosis not present

## 2024-01-29 DIAGNOSIS — I088 Other rheumatic multiple valve diseases: Secondary | ICD-10-CM | POA: Diagnosis not present

## 2024-01-29 DIAGNOSIS — E559 Vitamin D deficiency, unspecified: Secondary | ICD-10-CM | POA: Diagnosis not present

## 2024-01-29 DIAGNOSIS — E038 Other specified hypothyroidism: Secondary | ICD-10-CM | POA: Diagnosis not present

## 2024-01-29 DIAGNOSIS — D631 Anemia in chronic kidney disease: Secondary | ICD-10-CM | POA: Diagnosis not present

## 2024-01-29 DIAGNOSIS — I160 Hypertensive urgency: Secondary | ICD-10-CM | POA: Diagnosis not present

## 2024-01-29 DIAGNOSIS — M8580 Other specified disorders of bone density and structure, unspecified site: Secondary | ICD-10-CM | POA: Diagnosis not present

## 2024-01-29 DIAGNOSIS — I4891 Unspecified atrial fibrillation: Secondary | ICD-10-CM | POA: Diagnosis not present

## 2024-01-29 DIAGNOSIS — L89311 Pressure ulcer of right buttock, stage 1: Secondary | ICD-10-CM | POA: Diagnosis not present

## 2024-01-29 DIAGNOSIS — G2581 Restless legs syndrome: Secondary | ICD-10-CM | POA: Diagnosis not present

## 2024-01-29 DIAGNOSIS — G4700 Insomnia, unspecified: Secondary | ICD-10-CM | POA: Diagnosis not present

## 2024-01-29 DIAGNOSIS — J439 Emphysema, unspecified: Secondary | ICD-10-CM | POA: Diagnosis not present

## 2024-01-29 DIAGNOSIS — I5031 Acute diastolic (congestive) heart failure: Secondary | ICD-10-CM | POA: Diagnosis not present

## 2024-01-29 DIAGNOSIS — I130 Hypertensive heart and chronic kidney disease with heart failure and stage 1 through stage 4 chronic kidney disease, or unspecified chronic kidney disease: Secondary | ICD-10-CM | POA: Diagnosis not present

## 2024-01-29 DIAGNOSIS — J9611 Chronic respiratory failure with hypoxia: Secondary | ICD-10-CM | POA: Diagnosis not present

## 2024-01-29 DIAGNOSIS — E782 Mixed hyperlipidemia: Secondary | ICD-10-CM | POA: Diagnosis not present

## 2024-01-29 DIAGNOSIS — L89321 Pressure ulcer of left buttock, stage 1: Secondary | ICD-10-CM | POA: Diagnosis not present

## 2024-01-29 DIAGNOSIS — N183 Chronic kidney disease, stage 3 unspecified: Secondary | ICD-10-CM | POA: Diagnosis not present

## 2024-01-29 DIAGNOSIS — J441 Chronic obstructive pulmonary disease with (acute) exacerbation: Secondary | ICD-10-CM | POA: Diagnosis not present

## 2024-01-29 DIAGNOSIS — M150 Primary generalized (osteo)arthritis: Secondary | ICD-10-CM | POA: Diagnosis not present

## 2024-01-29 DIAGNOSIS — L89151 Pressure ulcer of sacral region, stage 1: Secondary | ICD-10-CM | POA: Diagnosis not present

## 2024-01-31 ENCOUNTER — Other Ambulatory Visit: Payer: Self-pay | Admitting: Cardiology

## 2024-01-31 DIAGNOSIS — M47812 Spondylosis without myelopathy or radiculopathy, cervical region: Secondary | ICD-10-CM | POA: Diagnosis not present

## 2024-01-31 DIAGNOSIS — J440 Chronic obstructive pulmonary disease with acute lower respiratory infection: Secondary | ICD-10-CM | POA: Diagnosis not present

## 2024-01-31 DIAGNOSIS — I4891 Unspecified atrial fibrillation: Secondary | ICD-10-CM | POA: Diagnosis not present

## 2024-01-31 DIAGNOSIS — M109 Gout, unspecified: Secondary | ICD-10-CM | POA: Diagnosis not present

## 2024-01-31 DIAGNOSIS — M150 Primary generalized (osteo)arthritis: Secondary | ICD-10-CM | POA: Diagnosis not present

## 2024-01-31 DIAGNOSIS — J439 Emphysema, unspecified: Secondary | ICD-10-CM | POA: Diagnosis not present

## 2024-01-31 DIAGNOSIS — L89321 Pressure ulcer of left buttock, stage 1: Secondary | ICD-10-CM | POA: Diagnosis not present

## 2024-01-31 DIAGNOSIS — M8580 Other specified disorders of bone density and structure, unspecified site: Secondary | ICD-10-CM | POA: Diagnosis not present

## 2024-01-31 DIAGNOSIS — L89311 Pressure ulcer of right buttock, stage 1: Secondary | ICD-10-CM | POA: Diagnosis not present

## 2024-01-31 DIAGNOSIS — N183 Chronic kidney disease, stage 3 unspecified: Secondary | ICD-10-CM | POA: Diagnosis not present

## 2024-01-31 DIAGNOSIS — G4700 Insomnia, unspecified: Secondary | ICD-10-CM | POA: Diagnosis not present

## 2024-01-31 DIAGNOSIS — D631 Anemia in chronic kidney disease: Secondary | ICD-10-CM | POA: Diagnosis not present

## 2024-01-31 DIAGNOSIS — I872 Venous insufficiency (chronic) (peripheral): Secondary | ICD-10-CM | POA: Diagnosis not present

## 2024-01-31 DIAGNOSIS — L89151 Pressure ulcer of sacral region, stage 1: Secondary | ICD-10-CM | POA: Diagnosis not present

## 2024-01-31 DIAGNOSIS — E559 Vitamin D deficiency, unspecified: Secondary | ICD-10-CM | POA: Diagnosis not present

## 2024-01-31 DIAGNOSIS — G2581 Restless legs syndrome: Secondary | ICD-10-CM | POA: Diagnosis not present

## 2024-01-31 DIAGNOSIS — I160 Hypertensive urgency: Secondary | ICD-10-CM | POA: Diagnosis not present

## 2024-01-31 DIAGNOSIS — I088 Other rheumatic multiple valve diseases: Secondary | ICD-10-CM | POA: Diagnosis not present

## 2024-01-31 DIAGNOSIS — I5031 Acute diastolic (congestive) heart failure: Secondary | ICD-10-CM | POA: Diagnosis not present

## 2024-01-31 DIAGNOSIS — E782 Mixed hyperlipidemia: Secondary | ICD-10-CM | POA: Diagnosis not present

## 2024-01-31 DIAGNOSIS — E038 Other specified hypothyroidism: Secondary | ICD-10-CM | POA: Diagnosis not present

## 2024-01-31 DIAGNOSIS — J9611 Chronic respiratory failure with hypoxia: Secondary | ICD-10-CM | POA: Diagnosis not present

## 2024-01-31 DIAGNOSIS — I130 Hypertensive heart and chronic kidney disease with heart failure and stage 1 through stage 4 chronic kidney disease, or unspecified chronic kidney disease: Secondary | ICD-10-CM | POA: Diagnosis not present

## 2024-01-31 DIAGNOSIS — J441 Chronic obstructive pulmonary disease with (acute) exacerbation: Secondary | ICD-10-CM | POA: Diagnosis not present

## 2024-02-01 DIAGNOSIS — I130 Hypertensive heart and chronic kidney disease with heart failure and stage 1 through stage 4 chronic kidney disease, or unspecified chronic kidney disease: Secondary | ICD-10-CM | POA: Diagnosis not present

## 2024-02-01 DIAGNOSIS — I872 Venous insufficiency (chronic) (peripheral): Secondary | ICD-10-CM | POA: Diagnosis not present

## 2024-02-01 DIAGNOSIS — I160 Hypertensive urgency: Secondary | ICD-10-CM | POA: Diagnosis not present

## 2024-02-01 DIAGNOSIS — M109 Gout, unspecified: Secondary | ICD-10-CM | POA: Diagnosis not present

## 2024-02-01 DIAGNOSIS — E559 Vitamin D deficiency, unspecified: Secondary | ICD-10-CM | POA: Diagnosis not present

## 2024-02-01 DIAGNOSIS — I4891 Unspecified atrial fibrillation: Secondary | ICD-10-CM | POA: Diagnosis not present

## 2024-02-01 DIAGNOSIS — E038 Other specified hypothyroidism: Secondary | ICD-10-CM | POA: Diagnosis not present

## 2024-02-01 DIAGNOSIS — D631 Anemia in chronic kidney disease: Secondary | ICD-10-CM | POA: Diagnosis not present

## 2024-02-01 DIAGNOSIS — N183 Chronic kidney disease, stage 3 unspecified: Secondary | ICD-10-CM | POA: Diagnosis not present

## 2024-02-01 DIAGNOSIS — L89311 Pressure ulcer of right buttock, stage 1: Secondary | ICD-10-CM | POA: Diagnosis not present

## 2024-02-01 DIAGNOSIS — J9611 Chronic respiratory failure with hypoxia: Secondary | ICD-10-CM | POA: Diagnosis not present

## 2024-02-01 DIAGNOSIS — L570 Actinic keratosis: Secondary | ICD-10-CM | POA: Diagnosis not present

## 2024-02-01 DIAGNOSIS — M47812 Spondylosis without myelopathy or radiculopathy, cervical region: Secondary | ICD-10-CM | POA: Diagnosis not present

## 2024-02-01 DIAGNOSIS — J439 Emphysema, unspecified: Secondary | ICD-10-CM | POA: Diagnosis not present

## 2024-02-01 DIAGNOSIS — M8580 Other specified disorders of bone density and structure, unspecified site: Secondary | ICD-10-CM | POA: Diagnosis not present

## 2024-02-01 DIAGNOSIS — I088 Other rheumatic multiple valve diseases: Secondary | ICD-10-CM | POA: Diagnosis not present

## 2024-02-01 DIAGNOSIS — G4700 Insomnia, unspecified: Secondary | ICD-10-CM | POA: Diagnosis not present

## 2024-02-01 DIAGNOSIS — E782 Mixed hyperlipidemia: Secondary | ICD-10-CM | POA: Diagnosis not present

## 2024-02-01 DIAGNOSIS — L89321 Pressure ulcer of left buttock, stage 1: Secondary | ICD-10-CM | POA: Diagnosis not present

## 2024-02-01 DIAGNOSIS — J440 Chronic obstructive pulmonary disease with acute lower respiratory infection: Secondary | ICD-10-CM | POA: Diagnosis not present

## 2024-02-01 DIAGNOSIS — M150 Primary generalized (osteo)arthritis: Secondary | ICD-10-CM | POA: Diagnosis not present

## 2024-02-01 DIAGNOSIS — J441 Chronic obstructive pulmonary disease with (acute) exacerbation: Secondary | ICD-10-CM | POA: Diagnosis not present

## 2024-02-01 DIAGNOSIS — G2581 Restless legs syndrome: Secondary | ICD-10-CM | POA: Diagnosis not present

## 2024-02-01 DIAGNOSIS — C44329 Squamous cell carcinoma of skin of other parts of face: Secondary | ICD-10-CM | POA: Diagnosis not present

## 2024-02-01 DIAGNOSIS — I5031 Acute diastolic (congestive) heart failure: Secondary | ICD-10-CM | POA: Diagnosis not present

## 2024-02-01 DIAGNOSIS — L89151 Pressure ulcer of sacral region, stage 1: Secondary | ICD-10-CM | POA: Diagnosis not present

## 2024-02-05 DIAGNOSIS — I160 Hypertensive urgency: Secondary | ICD-10-CM | POA: Diagnosis not present

## 2024-02-05 DIAGNOSIS — M109 Gout, unspecified: Secondary | ICD-10-CM | POA: Diagnosis not present

## 2024-02-05 DIAGNOSIS — G4700 Insomnia, unspecified: Secondary | ICD-10-CM | POA: Diagnosis not present

## 2024-02-05 DIAGNOSIS — G2581 Restless legs syndrome: Secondary | ICD-10-CM | POA: Diagnosis not present

## 2024-02-05 DIAGNOSIS — I130 Hypertensive heart and chronic kidney disease with heart failure and stage 1 through stage 4 chronic kidney disease, or unspecified chronic kidney disease: Secondary | ICD-10-CM | POA: Diagnosis not present

## 2024-02-05 DIAGNOSIS — L89311 Pressure ulcer of right buttock, stage 1: Secondary | ICD-10-CM | POA: Diagnosis not present

## 2024-02-05 DIAGNOSIS — N183 Chronic kidney disease, stage 3 unspecified: Secondary | ICD-10-CM | POA: Diagnosis not present

## 2024-02-05 DIAGNOSIS — L89151 Pressure ulcer of sacral region, stage 1: Secondary | ICD-10-CM | POA: Diagnosis not present

## 2024-02-05 DIAGNOSIS — I5031 Acute diastolic (congestive) heart failure: Secondary | ICD-10-CM | POA: Diagnosis not present

## 2024-02-05 DIAGNOSIS — M47812 Spondylosis without myelopathy or radiculopathy, cervical region: Secondary | ICD-10-CM | POA: Diagnosis not present

## 2024-02-05 DIAGNOSIS — I4891 Unspecified atrial fibrillation: Secondary | ICD-10-CM | POA: Diagnosis not present

## 2024-02-05 DIAGNOSIS — J439 Emphysema, unspecified: Secondary | ICD-10-CM | POA: Diagnosis not present

## 2024-02-05 DIAGNOSIS — M150 Primary generalized (osteo)arthritis: Secondary | ICD-10-CM | POA: Diagnosis not present

## 2024-02-05 DIAGNOSIS — L89321 Pressure ulcer of left buttock, stage 1: Secondary | ICD-10-CM | POA: Diagnosis not present

## 2024-02-05 DIAGNOSIS — D631 Anemia in chronic kidney disease: Secondary | ICD-10-CM | POA: Diagnosis not present

## 2024-02-05 DIAGNOSIS — I088 Other rheumatic multiple valve diseases: Secondary | ICD-10-CM | POA: Diagnosis not present

## 2024-02-05 DIAGNOSIS — E782 Mixed hyperlipidemia: Secondary | ICD-10-CM | POA: Diagnosis not present

## 2024-02-05 DIAGNOSIS — J440 Chronic obstructive pulmonary disease with acute lower respiratory infection: Secondary | ICD-10-CM | POA: Diagnosis not present

## 2024-02-05 DIAGNOSIS — J9611 Chronic respiratory failure with hypoxia: Secondary | ICD-10-CM | POA: Diagnosis not present

## 2024-02-05 DIAGNOSIS — I872 Venous insufficiency (chronic) (peripheral): Secondary | ICD-10-CM | POA: Diagnosis not present

## 2024-02-05 DIAGNOSIS — E559 Vitamin D deficiency, unspecified: Secondary | ICD-10-CM | POA: Diagnosis not present

## 2024-02-05 DIAGNOSIS — J441 Chronic obstructive pulmonary disease with (acute) exacerbation: Secondary | ICD-10-CM | POA: Diagnosis not present

## 2024-02-05 DIAGNOSIS — E038 Other specified hypothyroidism: Secondary | ICD-10-CM | POA: Diagnosis not present

## 2024-02-05 DIAGNOSIS — M8580 Other specified disorders of bone density and structure, unspecified site: Secondary | ICD-10-CM | POA: Diagnosis not present

## 2024-02-06 DIAGNOSIS — J441 Chronic obstructive pulmonary disease with (acute) exacerbation: Secondary | ICD-10-CM | POA: Diagnosis not present

## 2024-02-06 DIAGNOSIS — D631 Anemia in chronic kidney disease: Secondary | ICD-10-CM | POA: Diagnosis not present

## 2024-02-06 DIAGNOSIS — J9611 Chronic respiratory failure with hypoxia: Secondary | ICD-10-CM | POA: Diagnosis not present

## 2024-02-06 DIAGNOSIS — N183 Chronic kidney disease, stage 3 unspecified: Secondary | ICD-10-CM | POA: Diagnosis not present

## 2024-02-06 DIAGNOSIS — I872 Venous insufficiency (chronic) (peripheral): Secondary | ICD-10-CM | POA: Diagnosis not present

## 2024-02-06 DIAGNOSIS — M8580 Other specified disorders of bone density and structure, unspecified site: Secondary | ICD-10-CM | POA: Diagnosis not present

## 2024-02-06 DIAGNOSIS — L89321 Pressure ulcer of left buttock, stage 1: Secondary | ICD-10-CM | POA: Diagnosis not present

## 2024-02-06 DIAGNOSIS — E782 Mixed hyperlipidemia: Secondary | ICD-10-CM | POA: Diagnosis not present

## 2024-02-06 DIAGNOSIS — I5031 Acute diastolic (congestive) heart failure: Secondary | ICD-10-CM | POA: Diagnosis not present

## 2024-02-06 DIAGNOSIS — L89311 Pressure ulcer of right buttock, stage 1: Secondary | ICD-10-CM | POA: Diagnosis not present

## 2024-02-06 DIAGNOSIS — I088 Other rheumatic multiple valve diseases: Secondary | ICD-10-CM | POA: Diagnosis not present

## 2024-02-06 DIAGNOSIS — M109 Gout, unspecified: Secondary | ICD-10-CM | POA: Diagnosis not present

## 2024-02-06 DIAGNOSIS — I160 Hypertensive urgency: Secondary | ICD-10-CM | POA: Diagnosis not present

## 2024-02-06 DIAGNOSIS — J440 Chronic obstructive pulmonary disease with acute lower respiratory infection: Secondary | ICD-10-CM | POA: Diagnosis not present

## 2024-02-06 DIAGNOSIS — L89151 Pressure ulcer of sacral region, stage 1: Secondary | ICD-10-CM | POA: Diagnosis not present

## 2024-02-06 DIAGNOSIS — I4891 Unspecified atrial fibrillation: Secondary | ICD-10-CM | POA: Diagnosis not present

## 2024-02-06 DIAGNOSIS — E559 Vitamin D deficiency, unspecified: Secondary | ICD-10-CM | POA: Diagnosis not present

## 2024-02-06 DIAGNOSIS — G2581 Restless legs syndrome: Secondary | ICD-10-CM | POA: Diagnosis not present

## 2024-02-06 DIAGNOSIS — E038 Other specified hypothyroidism: Secondary | ICD-10-CM | POA: Diagnosis not present

## 2024-02-06 DIAGNOSIS — G4700 Insomnia, unspecified: Secondary | ICD-10-CM | POA: Diagnosis not present

## 2024-02-06 DIAGNOSIS — M47812 Spondylosis without myelopathy or radiculopathy, cervical region: Secondary | ICD-10-CM | POA: Diagnosis not present

## 2024-02-06 DIAGNOSIS — M150 Primary generalized (osteo)arthritis: Secondary | ICD-10-CM | POA: Diagnosis not present

## 2024-02-06 DIAGNOSIS — I130 Hypertensive heart and chronic kidney disease with heart failure and stage 1 through stage 4 chronic kidney disease, or unspecified chronic kidney disease: Secondary | ICD-10-CM | POA: Diagnosis not present

## 2024-02-06 DIAGNOSIS — J439 Emphysema, unspecified: Secondary | ICD-10-CM | POA: Diagnosis not present

## 2024-02-12 DIAGNOSIS — I160 Hypertensive urgency: Secondary | ICD-10-CM | POA: Diagnosis not present

## 2024-02-12 DIAGNOSIS — J9611 Chronic respiratory failure with hypoxia: Secondary | ICD-10-CM | POA: Diagnosis not present

## 2024-02-12 DIAGNOSIS — M109 Gout, unspecified: Secondary | ICD-10-CM | POA: Diagnosis not present

## 2024-02-12 DIAGNOSIS — G2581 Restless legs syndrome: Secondary | ICD-10-CM | POA: Diagnosis not present

## 2024-02-12 DIAGNOSIS — E782 Mixed hyperlipidemia: Secondary | ICD-10-CM | POA: Diagnosis not present

## 2024-02-12 DIAGNOSIS — I088 Other rheumatic multiple valve diseases: Secondary | ICD-10-CM | POA: Diagnosis not present

## 2024-02-12 DIAGNOSIS — E038 Other specified hypothyroidism: Secondary | ICD-10-CM | POA: Diagnosis not present

## 2024-02-12 DIAGNOSIS — J441 Chronic obstructive pulmonary disease with (acute) exacerbation: Secondary | ICD-10-CM | POA: Diagnosis not present

## 2024-02-12 DIAGNOSIS — I5031 Acute diastolic (congestive) heart failure: Secondary | ICD-10-CM | POA: Diagnosis not present

## 2024-02-12 DIAGNOSIS — M8580 Other specified disorders of bone density and structure, unspecified site: Secondary | ICD-10-CM | POA: Diagnosis not present

## 2024-02-12 DIAGNOSIS — G4700 Insomnia, unspecified: Secondary | ICD-10-CM | POA: Diagnosis not present

## 2024-02-12 DIAGNOSIS — D631 Anemia in chronic kidney disease: Secondary | ICD-10-CM | POA: Diagnosis not present

## 2024-02-12 DIAGNOSIS — I130 Hypertensive heart and chronic kidney disease with heart failure and stage 1 through stage 4 chronic kidney disease, or unspecified chronic kidney disease: Secondary | ICD-10-CM | POA: Diagnosis not present

## 2024-02-12 DIAGNOSIS — J440 Chronic obstructive pulmonary disease with acute lower respiratory infection: Secondary | ICD-10-CM | POA: Diagnosis not present

## 2024-02-12 DIAGNOSIS — L89151 Pressure ulcer of sacral region, stage 1: Secondary | ICD-10-CM | POA: Diagnosis not present

## 2024-02-12 DIAGNOSIS — L89321 Pressure ulcer of left buttock, stage 1: Secondary | ICD-10-CM | POA: Diagnosis not present

## 2024-02-12 DIAGNOSIS — L89311 Pressure ulcer of right buttock, stage 1: Secondary | ICD-10-CM | POA: Diagnosis not present

## 2024-02-12 DIAGNOSIS — E559 Vitamin D deficiency, unspecified: Secondary | ICD-10-CM | POA: Diagnosis not present

## 2024-02-12 DIAGNOSIS — M150 Primary generalized (osteo)arthritis: Secondary | ICD-10-CM | POA: Diagnosis not present

## 2024-02-12 DIAGNOSIS — I4891 Unspecified atrial fibrillation: Secondary | ICD-10-CM | POA: Diagnosis not present

## 2024-02-12 DIAGNOSIS — M47812 Spondylosis without myelopathy or radiculopathy, cervical region: Secondary | ICD-10-CM | POA: Diagnosis not present

## 2024-02-12 DIAGNOSIS — J439 Emphysema, unspecified: Secondary | ICD-10-CM | POA: Diagnosis not present

## 2024-02-12 DIAGNOSIS — N183 Chronic kidney disease, stage 3 unspecified: Secondary | ICD-10-CM | POA: Diagnosis not present

## 2024-02-12 DIAGNOSIS — I872 Venous insufficiency (chronic) (peripheral): Secondary | ICD-10-CM | POA: Diagnosis not present

## 2024-02-13 DIAGNOSIS — M8580 Other specified disorders of bone density and structure, unspecified site: Secondary | ICD-10-CM | POA: Diagnosis not present

## 2024-02-13 DIAGNOSIS — L89311 Pressure ulcer of right buttock, stage 1: Secondary | ICD-10-CM | POA: Diagnosis not present

## 2024-02-13 DIAGNOSIS — I160 Hypertensive urgency: Secondary | ICD-10-CM | POA: Diagnosis not present

## 2024-02-13 DIAGNOSIS — I5031 Acute diastolic (congestive) heart failure: Secondary | ICD-10-CM | POA: Diagnosis not present

## 2024-02-13 DIAGNOSIS — J440 Chronic obstructive pulmonary disease with acute lower respiratory infection: Secondary | ICD-10-CM | POA: Diagnosis not present

## 2024-02-13 DIAGNOSIS — D631 Anemia in chronic kidney disease: Secondary | ICD-10-CM | POA: Diagnosis not present

## 2024-02-13 DIAGNOSIS — I088 Other rheumatic multiple valve diseases: Secondary | ICD-10-CM | POA: Diagnosis not present

## 2024-02-13 DIAGNOSIS — G4700 Insomnia, unspecified: Secondary | ICD-10-CM | POA: Diagnosis not present

## 2024-02-13 DIAGNOSIS — M150 Primary generalized (osteo)arthritis: Secondary | ICD-10-CM | POA: Diagnosis not present

## 2024-02-13 DIAGNOSIS — J439 Emphysema, unspecified: Secondary | ICD-10-CM | POA: Diagnosis not present

## 2024-02-13 DIAGNOSIS — J441 Chronic obstructive pulmonary disease with (acute) exacerbation: Secondary | ICD-10-CM | POA: Diagnosis not present

## 2024-02-13 DIAGNOSIS — M109 Gout, unspecified: Secondary | ICD-10-CM | POA: Diagnosis not present

## 2024-02-13 DIAGNOSIS — J9611 Chronic respiratory failure with hypoxia: Secondary | ICD-10-CM | POA: Diagnosis not present

## 2024-02-13 DIAGNOSIS — E782 Mixed hyperlipidemia: Secondary | ICD-10-CM | POA: Diagnosis not present

## 2024-02-13 DIAGNOSIS — G2581 Restless legs syndrome: Secondary | ICD-10-CM | POA: Diagnosis not present

## 2024-02-13 DIAGNOSIS — I130 Hypertensive heart and chronic kidney disease with heart failure and stage 1 through stage 4 chronic kidney disease, or unspecified chronic kidney disease: Secondary | ICD-10-CM | POA: Diagnosis not present

## 2024-02-13 DIAGNOSIS — E559 Vitamin D deficiency, unspecified: Secondary | ICD-10-CM | POA: Diagnosis not present

## 2024-02-13 DIAGNOSIS — L89321 Pressure ulcer of left buttock, stage 1: Secondary | ICD-10-CM | POA: Diagnosis not present

## 2024-02-13 DIAGNOSIS — N183 Chronic kidney disease, stage 3 unspecified: Secondary | ICD-10-CM | POA: Diagnosis not present

## 2024-02-13 DIAGNOSIS — M47812 Spondylosis without myelopathy or radiculopathy, cervical region: Secondary | ICD-10-CM | POA: Diagnosis not present

## 2024-02-13 DIAGNOSIS — I4891 Unspecified atrial fibrillation: Secondary | ICD-10-CM | POA: Diagnosis not present

## 2024-02-13 DIAGNOSIS — I872 Venous insufficiency (chronic) (peripheral): Secondary | ICD-10-CM | POA: Diagnosis not present

## 2024-02-13 DIAGNOSIS — L89151 Pressure ulcer of sacral region, stage 1: Secondary | ICD-10-CM | POA: Diagnosis not present

## 2024-02-13 DIAGNOSIS — E038 Other specified hypothyroidism: Secondary | ICD-10-CM | POA: Diagnosis not present

## 2024-02-18 DIAGNOSIS — R0902 Hypoxemia: Secondary | ICD-10-CM | POA: Diagnosis not present

## 2024-02-27 DIAGNOSIS — J9611 Chronic respiratory failure with hypoxia: Secondary | ICD-10-CM | POA: Diagnosis not present

## 2024-02-27 DIAGNOSIS — N183 Chronic kidney disease, stage 3 unspecified: Secondary | ICD-10-CM | POA: Diagnosis not present

## 2024-02-27 DIAGNOSIS — K279 Peptic ulcer, site unspecified, unspecified as acute or chronic, without hemorrhage or perforation: Secondary | ICD-10-CM | POA: Diagnosis not present

## 2024-02-27 DIAGNOSIS — G4700 Insomnia, unspecified: Secondary | ICD-10-CM | POA: Diagnosis not present

## 2024-02-27 DIAGNOSIS — I5030 Unspecified diastolic (congestive) heart failure: Secondary | ICD-10-CM | POA: Diagnosis not present

## 2024-02-27 DIAGNOSIS — M109 Gout, unspecified: Secondary | ICD-10-CM | POA: Diagnosis not present

## 2024-02-27 DIAGNOSIS — I10 Essential (primary) hypertension: Secondary | ICD-10-CM | POA: Diagnosis not present

## 2024-02-27 DIAGNOSIS — E039 Hypothyroidism, unspecified: Secondary | ICD-10-CM | POA: Diagnosis not present

## 2024-02-27 DIAGNOSIS — I4891 Unspecified atrial fibrillation: Secondary | ICD-10-CM | POA: Diagnosis not present

## 2024-02-27 DIAGNOSIS — Z79899 Other long term (current) drug therapy: Secondary | ICD-10-CM | POA: Diagnosis not present

## 2024-02-27 DIAGNOSIS — J449 Chronic obstructive pulmonary disease, unspecified: Secondary | ICD-10-CM | POA: Diagnosis not present

## 2024-02-27 DIAGNOSIS — E782 Mixed hyperlipidemia: Secondary | ICD-10-CM | POA: Diagnosis not present

## 2024-03-08 ENCOUNTER — Other Ambulatory Visit: Payer: Self-pay

## 2024-03-08 DIAGNOSIS — I480 Paroxysmal atrial fibrillation: Secondary | ICD-10-CM

## 2024-03-08 MED ORDER — RIVAROXABAN 15 MG PO TABS: 15.0000 mg | ORAL_TABLET | Freq: Every day | ORAL | 1 refills | Status: DC

## 2024-03-08 NOTE — Telephone Encounter (Signed)
 Prescription refill request for Xarelto received.  Indication:afib Last office visit:1/25 Weight:70.6  kg Age:89 Scr:1.05  3/25 CrCl:38.9  ml/min  Prescription refilled

## 2024-03-17 DIAGNOSIS — R0902 Hypoxemia: Secondary | ICD-10-CM | POA: Diagnosis not present

## 2024-04-17 DIAGNOSIS — R0902 Hypoxemia: Secondary | ICD-10-CM | POA: Diagnosis not present

## 2024-05-09 DIAGNOSIS — J449 Chronic obstructive pulmonary disease, unspecified: Secondary | ICD-10-CM | POA: Diagnosis not present

## 2024-05-09 DIAGNOSIS — R918 Other nonspecific abnormal finding of lung field: Secondary | ICD-10-CM | POA: Diagnosis not present

## 2024-05-17 DIAGNOSIS — R0902 Hypoxemia: Secondary | ICD-10-CM | POA: Diagnosis not present

## 2024-05-31 DIAGNOSIS — N183 Chronic kidney disease, stage 3 unspecified: Secondary | ICD-10-CM | POA: Diagnosis not present

## 2024-05-31 DIAGNOSIS — I4891 Unspecified atrial fibrillation: Secondary | ICD-10-CM | POA: Diagnosis not present

## 2024-05-31 DIAGNOSIS — M109 Gout, unspecified: Secondary | ICD-10-CM | POA: Diagnosis not present

## 2024-05-31 DIAGNOSIS — J9611 Chronic respiratory failure with hypoxia: Secondary | ICD-10-CM | POA: Diagnosis not present

## 2024-05-31 DIAGNOSIS — Z79899 Other long term (current) drug therapy: Secondary | ICD-10-CM | POA: Diagnosis not present

## 2024-05-31 DIAGNOSIS — K279 Peptic ulcer, site unspecified, unspecified as acute or chronic, without hemorrhage or perforation: Secondary | ICD-10-CM | POA: Diagnosis not present

## 2024-05-31 DIAGNOSIS — E782 Mixed hyperlipidemia: Secondary | ICD-10-CM | POA: Diagnosis not present

## 2024-05-31 DIAGNOSIS — G4700 Insomnia, unspecified: Secondary | ICD-10-CM | POA: Diagnosis not present

## 2024-05-31 DIAGNOSIS — J449 Chronic obstructive pulmonary disease, unspecified: Secondary | ICD-10-CM | POA: Diagnosis not present

## 2024-05-31 DIAGNOSIS — Z23 Encounter for immunization: Secondary | ICD-10-CM | POA: Diagnosis not present

## 2024-05-31 DIAGNOSIS — E039 Hypothyroidism, unspecified: Secondary | ICD-10-CM | POA: Diagnosis not present

## 2024-05-31 DIAGNOSIS — I10 Essential (primary) hypertension: Secondary | ICD-10-CM | POA: Diagnosis not present

## 2024-05-31 DIAGNOSIS — I5030 Unspecified diastolic (congestive) heart failure: Secondary | ICD-10-CM | POA: Diagnosis not present

## 2024-06-10 ENCOUNTER — Other Ambulatory Visit: Payer: Self-pay

## 2024-06-10 DIAGNOSIS — I480 Paroxysmal atrial fibrillation: Secondary | ICD-10-CM

## 2024-06-10 MED ORDER — RIVAROXABAN 15 MG PO TABS: 15.0000 mg | ORAL_TABLET | Freq: Every day | ORAL | 1 refills | Status: AC

## 2024-06-10 NOTE — Telephone Encounter (Signed)
 Prescription refill request for Xarelto  received.  Indication: Afib  Last office visit: 12/28/23 ([facility])  Weight: 70.6kg Age: 89 Scr: 1.36 (05/31/24 via [facility]) CrCl: 30.03ml/min  Appropriate dose. Refill sent.

## 2024-06-17 DIAGNOSIS — R0902 Hypoxemia: Secondary | ICD-10-CM | POA: Diagnosis not present

## 2024-07-17 DIAGNOSIS — R0902 Hypoxemia: Secondary | ICD-10-CM | POA: Diagnosis not present

## 2024-07-20 DIAGNOSIS — I2109 ST elevation (STEMI) myocardial infarction involving other coronary artery of anterior wall: Secondary | ICD-10-CM | POA: Diagnosis not present

## 2024-07-20 DIAGNOSIS — I4891 Unspecified atrial fibrillation: Secondary | ICD-10-CM | POA: Diagnosis not present

## 2024-07-20 DIAGNOSIS — I110 Hypertensive heart disease with heart failure: Secondary | ICD-10-CM | POA: Diagnosis not present

## 2024-07-20 DIAGNOSIS — I509 Heart failure, unspecified: Secondary | ICD-10-CM | POA: Diagnosis not present

## 2024-07-20 DIAGNOSIS — Z743 Need for continuous supervision: Secondary | ICD-10-CM | POA: Diagnosis not present

## 2024-07-20 DIAGNOSIS — I160 Hypertensive urgency: Secondary | ICD-10-CM | POA: Diagnosis not present

## 2024-07-20 DIAGNOSIS — M542 Cervicalgia: Secondary | ICD-10-CM | POA: Diagnosis not present

## 2024-07-20 DIAGNOSIS — R110 Nausea: Secondary | ICD-10-CM | POA: Diagnosis not present

## 2024-07-20 DIAGNOSIS — R519 Headache, unspecified: Secondary | ICD-10-CM | POA: Diagnosis not present

## 2024-07-20 DIAGNOSIS — I444 Left anterior fascicular block: Secondary | ICD-10-CM | POA: Diagnosis not present

## 2024-07-20 DIAGNOSIS — I10 Essential (primary) hypertension: Secondary | ICD-10-CM | POA: Diagnosis not present

## 2024-07-20 DIAGNOSIS — H539 Unspecified visual disturbance: Secondary | ICD-10-CM | POA: Diagnosis not present

## 2024-07-20 DIAGNOSIS — R9431 Abnormal electrocardiogram [ECG] [EKG]: Secondary | ICD-10-CM | POA: Diagnosis not present

## 2024-07-31 DIAGNOSIS — I5030 Unspecified diastolic (congestive) heart failure: Secondary | ICD-10-CM | POA: Diagnosis not present

## 2024-07-31 DIAGNOSIS — Z9181 History of falling: Secondary | ICD-10-CM | POA: Diagnosis not present

## 2024-07-31 DIAGNOSIS — I10 Essential (primary) hypertension: Secondary | ICD-10-CM | POA: Diagnosis not present

## 2024-07-31 DIAGNOSIS — N183 Chronic kidney disease, stage 3 unspecified: Secondary | ICD-10-CM | POA: Diagnosis not present

## 2024-07-31 DIAGNOSIS — M542 Cervicalgia: Secondary | ICD-10-CM | POA: Diagnosis not present

## 2024-07-31 DIAGNOSIS — I4891 Unspecified atrial fibrillation: Secondary | ICD-10-CM | POA: Diagnosis not present

## 2024-08-01 DIAGNOSIS — R918 Other nonspecific abnormal finding of lung field: Secondary | ICD-10-CM | POA: Diagnosis not present

## 2024-08-01 DIAGNOSIS — J449 Chronic obstructive pulmonary disease, unspecified: Secondary | ICD-10-CM | POA: Diagnosis not present

## 2024-08-17 DIAGNOSIS — R0902 Hypoxemia: Secondary | ICD-10-CM | POA: Diagnosis not present

## 2024-09-03 DIAGNOSIS — E782 Mixed hyperlipidemia: Secondary | ICD-10-CM | POA: Diagnosis not present

## 2024-09-03 DIAGNOSIS — G4700 Insomnia, unspecified: Secondary | ICD-10-CM | POA: Diagnosis not present

## 2024-09-03 DIAGNOSIS — I10 Essential (primary) hypertension: Secondary | ICD-10-CM | POA: Diagnosis not present

## 2024-09-03 DIAGNOSIS — I5030 Unspecified diastolic (congestive) heart failure: Secondary | ICD-10-CM | POA: Diagnosis not present

## 2024-09-03 DIAGNOSIS — N183 Chronic kidney disease, stage 3 unspecified: Secondary | ICD-10-CM | POA: Diagnosis not present

## 2024-09-03 DIAGNOSIS — J449 Chronic obstructive pulmonary disease, unspecified: Secondary | ICD-10-CM | POA: Diagnosis not present

## 2024-09-03 DIAGNOSIS — M109 Gout, unspecified: Secondary | ICD-10-CM | POA: Diagnosis not present

## 2024-09-03 DIAGNOSIS — E039 Hypothyroidism, unspecified: Secondary | ICD-10-CM | POA: Diagnosis not present

## 2024-09-03 DIAGNOSIS — Z79899 Other long term (current) drug therapy: Secondary | ICD-10-CM | POA: Diagnosis not present

## 2024-09-03 DIAGNOSIS — I4891 Unspecified atrial fibrillation: Secondary | ICD-10-CM | POA: Diagnosis not present

## 2024-09-03 DIAGNOSIS — J9611 Chronic respiratory failure with hypoxia: Secondary | ICD-10-CM | POA: Diagnosis not present

## 2024-09-03 DIAGNOSIS — K279 Peptic ulcer, site unspecified, unspecified as acute or chronic, without hemorrhage or perforation: Secondary | ICD-10-CM | POA: Diagnosis not present

## 2024-09-17 DIAGNOSIS — R0902 Hypoxemia: Secondary | ICD-10-CM | POA: Diagnosis not present

## 2024-09-19 ENCOUNTER — Ambulatory Visit: Admitting: Cardiology

## 2024-09-23 DIAGNOSIS — R918 Other nonspecific abnormal finding of lung field: Secondary | ICD-10-CM | POA: Diagnosis not present

## 2024-09-23 DIAGNOSIS — J449 Chronic obstructive pulmonary disease, unspecified: Secondary | ICD-10-CM | POA: Diagnosis not present

## 2024-10-17 DIAGNOSIS — R0902 Hypoxemia: Secondary | ICD-10-CM | POA: Diagnosis not present

## 2024-10-31 ENCOUNTER — Encounter: Payer: Self-pay | Admitting: Cardiology

## 2024-10-31 ENCOUNTER — Ambulatory Visit: Attending: Cardiology | Admitting: Cardiology

## 2024-10-31 VITALS — BP 140/96 | HR 92 | Ht 62.6 in | Wt 165.4 lb

## 2024-10-31 DIAGNOSIS — I340 Nonrheumatic mitral (valve) insufficiency: Secondary | ICD-10-CM

## 2024-10-31 DIAGNOSIS — I350 Nonrheumatic aortic (valve) stenosis: Secondary | ICD-10-CM | POA: Diagnosis not present

## 2024-10-31 DIAGNOSIS — I480 Paroxysmal atrial fibrillation: Secondary | ICD-10-CM

## 2024-10-31 NOTE — Patient Instructions (Signed)

## 2024-10-31 NOTE — Progress Notes (Signed)
 Cardiology Office Note:    Date:  10/31/2024   ID:  Tiffany Mitchell, DOB [birth year], MRN [id number]  PCP:  Montey Lot, PA-C  Cardiologist:  Jennifer JONELLE Crape, MD   Referring MD: Montey Lot, PA-C    ASSESSMENT:    1. Moderate mitral insufficiency   2. PAF (paroxysmal atrial fibrillation) ([facility])   3. Mild aortic stenosis    PLAN:    In order of problems listed above:  Primary prevention stressed with the patient.  Importance of compliance with diet medication stressed and patient verbalized standing. Essential hypertension: Blood pressure is stable and diet was emphasized.  Her daughter mentions to me that blood pressures are fine.  Patient forgot to take her medications this morning.  Generally blood pressure is in the range of 140/70.  Diet and salt intake issues were discussed Mild aortic stenosis: Stable at we will continue to monitor. Moderate mitral regurgitation: Will continue to monitor.  Blood pressure will have to be optimized and compliance with medications urged Mixed dyslipidemia: On lipid-lowering medications followed by primary care.  Diet emphasized. Patient will be seen in follow-up appointment in 6 months or earlier if the patient has any concerns.    Medication Adjustments/Labs and Tests Ordered: Current medicines are reviewed at length with the patient today.  Concerns regarding medicines are outlined above.  No orders of the defined types were placed in this encounter.  No orders of the defined types were placed in this encounter.    No chief complaint on file.    History of Present Illness:    Tiffany Mitchell is a 89 y.o. female.  Patient has past medical history of essential hypertension, atrial fibrillation, mixed dyslipidemia, mitral regurgitation moderate and mild aortic stenosis.  She denies any problems at this time and takes care of activities of daily living.  No chest pain orthopnea or PND.  At the time of my evaluation, the patient  is alert awake oriented and in no distress.  She ambulates age appropriately.  Past Medical History:  Diagnosis Date   Aortic stenosis 02/24/2022   Bilateral leg edema    Bilateral leg edema    Calculus of gallbladder with chronic cholecystitis without obstruction 10/12/2016   Chronic kidney disease 07/09/2015   Cigarette smoker 07/09/2015   CKD (chronic kidney disease)    CKD (chronic kidney disease)    COPD (chronic obstructive pulmonary disease) ([facility])    Cystocele 02/12/2016   Diastolic dysfunction    DOE (dyspnea on exertion) 11/01/2019   Eczema    Essential hypertension 07/09/2015   Gout    Hypercalcemia    Hyperlipidemia    Lumbar pain with radiation down right leg    Lumbar pain with radiation down right leg    Mild aortic stenosis 02/28/2023   Mild mitral stenosis 02/28/2023   Moderate mitral insufficiency 07/09/2015   Osteoarthritis of left knee    Osteopenia    PAF (paroxysmal atrial fibrillation) ([facility]) 01/22/2020   Pain in both upper extremities    Pedal edema 11/01/2019   Peptic ulcer disease    Personal history of transient ischemic attack (TIA), and cerebral infarction without residual deficits 07/09/2015   Postoperative examination 02/12/2016   Pure hypercholesterolemia 07/09/2015   Stress incontinence in female 02/12/2016   Vitamin D deficiency     Past Surgical History:  Procedure Laterality Date   ABDOMINAL HYSTERECTOMY     APPENDECTOMY     KIDNEY SURGERY     OVARIAN CYST SURGERY  TUBAL LIGATION      Current Medications: Current Meds  Medication Sig   acetaminophen (TYLENOL) 650 MG CR tablet Take 650 mg by mouth every 8 (eight) hours as needed for pain.   albuterol  (VENTOLIN  HFA) 108 (90 Base) MCG/ACT inhaler Inhale 1-2 puffs into the lungs every 4 (four) hours as needed for shortness of breath.   allopurinol (ZYLOPRIM) 100 MG tablet Take 200 mg by mouth daily.   carvedilol (COREG) 12.5 MG tablet Take 12.5 mg by mouth 2 (two) times daily.    Cholecalciferol (VITAMIN D3) 50 MCG (2000 UT) capsule Take 2,000 Units by mouth daily.   colestipol (COLESTID) 1 g tablet Take 1 g by mouth 3 (three) times daily as needed.   digoxin  (LANOXIN ) 0.125 MG tablet Take 1 tablet (0.125 mg total) by mouth every other day.   doxazosin (CARDURA) 8 MG tablet Take 8 mg by mouth at bedtime.   hydrocortisone 2.5 % cream Apply 1 Application topically as needed for itching or rash.   losartan  (COZAAR ) 100 MG tablet Take 100 mg by mouth daily.   pantoprazole (PROTONIX) 40 MG tablet Take 40 mg by mouth daily.   pravastatin (PRAVACHOL) 40 MG tablet Take 40 mg by mouth at bedtime.   Prenatal Vit-Fe Fumarate-FA (PNV PRENATAL PLUS MULTIVITAMIN) 27-1 MG TABS Take 1 tablet by mouth daily.   Rivaroxaban  (XARELTO ) 15 MG TABS tablet Take 1 tablet (15 mg total) by mouth daily with supper.   torsemide (DEMADEX) 10 MG tablet Take 10 mg by mouth every other day. And Monday, Wednesday, and Friday take 20 mg   traZODone (DESYREL) 100 MG tablet Take 100 mg by mouth at bedtime.   TRELEGY ELLIPTA 100-62.5-25 MCG/ACT AEPB Take 1 puff by mouth daily.   vitamin B-12 (CYANOCOBALAMIN ) 100 MCG tablet Take 100 mcg by mouth daily.     Allergies:   Amlodipine and Hydrocodone-acetaminophen   Social History   Socioeconomic History   Marital status: Single    Spouse name: Not on file   Number of children: Not on file   Years of education: Not on file   Highest education level: Not on file  Occupational History   Not on file  Tobacco Use   Smoking status: Former    Current packs/day: 0.00    Types: Cigarettes    Quit date: 01/19/2020    Years since quitting: 4.7   Smokeless tobacco: Never  Substance and Sexual Activity   Alcohol use: Never   Drug use: Never   Sexual activity: Not on file  Other Topics Concern   Not on file  Social History Narrative   Not on file   Social Drivers of Health   Financial Resource Strain: Not on file  Food Insecurity: Not on file   Transportation Needs: Not on file  Physical Activity: Not on file  Stress: Not on file  Social Connections: Not on file     Family History: The patient's family history includes Diabetes in her sister.  ROS:   Please see the history of present illness.    All other systems reviewed and are negative.  EKGs/Labs/Other Studies Reviewed:    The following studies were reviewed today: I discussed my findings with the patient at length   Recent Labs: No results found for requested labs within last 365 days.  Recent Lipid Panel No results found for: CHOL, TRIG, HDL, CHOLHDL, VLDL, LDLCALC, LDLDIRECT  Physical Exam:    VS:  BP (!) 160/98   Pulse 92  Ht 5' 2.6 (1.59 m)   Wt 165 lb 6.4 oz (75 kg)   SpO2 93%   BMI 29.67 kg/m     Wt Readings from Last 3 Encounters:  10/31/24 165 lb 6.4 oz (75 kg)  12/28/23 155 lb 9.6 oz (70.6 kg)  02/28/23 162 lb 6.4 oz (73.7 kg)     GEN: Patient is in no acute distress HEENT: Normal NECK: No JVD; No carotid bruits LYMPHATICS: No lymphadenopathy CARDIAC: Hear sounds irregular, 2/6 systolic murmur at the apex. RESPIRATORY:  Clear to auscultation without rales, wheezing or rhonchi  ABDOMEN: Soft, non-tender, non-distended MUSCULOSKELETAL:  No edema; No deformity  SKIN: Warm and dry NEUROLOGIC:  Alert and oriented x 3 PSYCHIATRIC:  Normal affect   Signed, Jennifer JONELLE Crape, MD  10/31/2024 1:23 PM    [facility]

## 2024-12-11 ENCOUNTER — Other Ambulatory Visit: Payer: Self-pay | Admitting: Cardiology
